# Patient Record
Sex: Female | Born: 1970 | Race: White | Hispanic: No | Marital: Single | Smoking: Former smoker
Health system: Southern US, Community
[De-identification: ages and names within clinical notes are randomized; demographics above are authoritative.]

## PROBLEM LIST (undated history)

## (undated) DIAGNOSIS — I2699 Other pulmonary embolism without acute cor pulmonale: Secondary | ICD-10-CM

## (undated) DIAGNOSIS — E119 Type 2 diabetes mellitus without complications: Secondary | ICD-10-CM

## (undated) DIAGNOSIS — I82401 Acute embolism and thrombosis of unspecified deep veins of right lower extremity: Secondary | ICD-10-CM

## (undated) DIAGNOSIS — L0390 Cellulitis, unspecified: Secondary | ICD-10-CM

## (undated) DIAGNOSIS — R0609 Other forms of dyspnea: Secondary | ICD-10-CM

## (undated) DIAGNOSIS — G4730 Sleep apnea, unspecified: Secondary | ICD-10-CM

## (undated) DIAGNOSIS — I10 Essential (primary) hypertension: Secondary | ICD-10-CM

## (undated) DIAGNOSIS — G509 Disorder of trigeminal nerve, unspecified: Secondary | ICD-10-CM

## (undated) DIAGNOSIS — M109 Gout, unspecified: Secondary | ICD-10-CM

## (undated) DIAGNOSIS — C55 Malignant neoplasm of uterus, part unspecified: Secondary | ICD-10-CM

## (undated) HISTORY — DX: Gout, unspecified: M10.9

---

## 2005-01-25 ENCOUNTER — Emergency Department (HOSPITAL_COMMUNITY): Admission: EM | Admit: 2005-01-25 | Discharge: 2005-01-25 | Payer: Self-pay | Admitting: Family Medicine

## 2005-12-06 ENCOUNTER — Emergency Department (HOSPITAL_COMMUNITY): Admission: EM | Admit: 2005-12-06 | Discharge: 2005-12-06 | Payer: Self-pay | Admitting: Family Medicine

## 2007-09-28 DIAGNOSIS — C55 Malignant neoplasm of uterus, part unspecified: Secondary | ICD-10-CM

## 2007-09-28 HISTORY — PX: ABDOMINAL HYSTERECTOMY: SHX81

## 2007-09-28 HISTORY — DX: Malignant neoplasm of uterus, part unspecified: C55

## 2009-06-29 ENCOUNTER — Emergency Department (HOSPITAL_COMMUNITY): Admission: EM | Admit: 2009-06-29 | Discharge: 2009-06-30 | Payer: Self-pay | Admitting: Emergency Medicine

## 2011-10-05 ENCOUNTER — Other Ambulatory Visit: Payer: Self-pay | Admitting: Family Medicine

## 2011-10-05 DIAGNOSIS — R109 Unspecified abdominal pain: Secondary | ICD-10-CM

## 2011-10-06 ENCOUNTER — Ambulatory Visit
Admission: RE | Admit: 2011-10-06 | Discharge: 2011-10-06 | Disposition: A | Discharge status: Home / Self Care | Payer: BC Managed Care – PPO | Source: Ambulatory Visit | Attending: Family Medicine | Admitting: Family Medicine

## 2011-10-06 DIAGNOSIS — R109 Unspecified abdominal pain: Secondary | ICD-10-CM

## 2012-07-12 ENCOUNTER — Other Ambulatory Visit: Payer: Self-pay | Admitting: Neurology

## 2012-07-12 DIAGNOSIS — G500 Trigeminal neuralgia: Secondary | ICD-10-CM

## 2012-07-12 DIAGNOSIS — G4733 Obstructive sleep apnea (adult) (pediatric): Secondary | ICD-10-CM

## 2012-07-12 DIAGNOSIS — Z8542 Personal history of malignant neoplasm of other parts of uterus: Secondary | ICD-10-CM

## 2012-07-12 DIAGNOSIS — I10 Essential (primary) hypertension: Secondary | ICD-10-CM

## 2012-07-19 ENCOUNTER — Ambulatory Visit
Admission: RE | Admit: 2012-07-19 | Discharge: 2012-07-19 | Disposition: A | Discharge status: Home / Self Care | Payer: BC Managed Care – PPO | Source: Ambulatory Visit | Attending: Neurology | Admitting: Neurology

## 2012-07-19 DIAGNOSIS — I10 Essential (primary) hypertension: Secondary | ICD-10-CM

## 2012-07-19 DIAGNOSIS — G500 Trigeminal neuralgia: Secondary | ICD-10-CM

## 2012-07-19 DIAGNOSIS — G4733 Obstructive sleep apnea (adult) (pediatric): Secondary | ICD-10-CM

## 2012-07-19 DIAGNOSIS — Z8542 Personal history of malignant neoplasm of other parts of uterus: Secondary | ICD-10-CM

## 2012-07-19 MED ORDER — GADOBENATE DIMEGLUMINE 529 MG/ML IV SOLN
20.0000 mL | Freq: Once | INTRAVENOUS | Status: AC | PRN
  Administered 2012-07-19: 20 mL via INTRAVENOUS

## 2012-10-05 DIAGNOSIS — R0609 Other forms of dyspnea: Secondary | ICD-10-CM

## 2012-10-05 DIAGNOSIS — R0600 Dyspnea, unspecified: Secondary | ICD-10-CM

## 2012-10-05 HISTORY — DX: Dyspnea, unspecified: R06.00

## 2012-10-05 HISTORY — DX: Other forms of dyspnea: R06.09

## 2012-10-09 ENCOUNTER — Emergency Department (HOSPITAL_COMMUNITY): Payer: BC Managed Care – PPO

## 2012-10-09 ENCOUNTER — Inpatient Hospital Stay (HOSPITAL_COMMUNITY)
Admission: EM | Admit: 2012-10-09 | Discharge: 2012-10-11 | DRG: 541 | Disposition: A | Discharge status: Home / Self Care | Payer: BC Managed Care – PPO | Attending: Internal Medicine | Admitting: Internal Medicine

## 2012-10-09 ENCOUNTER — Encounter (HOSPITAL_COMMUNITY): Payer: Self-pay | Admitting: *Deleted

## 2012-10-09 DIAGNOSIS — Z6841 Body Mass Index (BMI) 40.0 and over, adult: Secondary | ICD-10-CM

## 2012-10-09 DIAGNOSIS — G500 Trigeminal neuralgia: Secondary | ICD-10-CM | POA: Diagnosis present

## 2012-10-09 DIAGNOSIS — Z79899 Other long term (current) drug therapy: Secondary | ICD-10-CM

## 2012-10-09 DIAGNOSIS — R0600 Dyspnea, unspecified: Secondary | ICD-10-CM

## 2012-10-09 DIAGNOSIS — I2699 Other pulmonary embolism without acute cor pulmonale: Principal | ICD-10-CM

## 2012-10-09 DIAGNOSIS — I824Y9 Acute embolism and thrombosis of unspecified deep veins of unspecified proximal lower extremity: Secondary | ICD-10-CM | POA: Diagnosis present

## 2012-10-09 DIAGNOSIS — Z87891 Personal history of nicotine dependence: Secondary | ICD-10-CM

## 2012-10-09 DIAGNOSIS — T384X5A Adverse effect of oral contraceptives, initial encounter: Secondary | ICD-10-CM | POA: Diagnosis present

## 2012-10-09 DIAGNOSIS — Z23 Encounter for immunization: Secondary | ICD-10-CM

## 2012-10-09 DIAGNOSIS — Z8542 Personal history of malignant neoplasm of other parts of uterus: Secondary | ICD-10-CM

## 2012-10-09 DIAGNOSIS — E669 Obesity, unspecified: Secondary | ICD-10-CM | POA: Diagnosis present

## 2012-10-09 HISTORY — DX: Other pulmonary embolism without acute cor pulmonale: I26.99

## 2012-10-09 HISTORY — DX: Cellulitis, unspecified: L03.90

## 2012-10-09 HISTORY — DX: Other forms of dyspnea: R06.09

## 2012-10-09 HISTORY — DX: Disorder of trigeminal nerve, unspecified: G50.9

## 2012-10-09 HISTORY — DX: Sleep apnea, unspecified: G47.30

## 2012-10-09 HISTORY — DX: Malignant neoplasm of uterus, part unspecified: C55

## 2012-10-09 LAB — COMPREHENSIVE METABOLIC PANEL
Albumin: 3.9 g/dL (ref 3.5–5.2)
BUN: 17 mg/dL (ref 6–23)
CO2: 18 mEq/L — ABNORMAL LOW (ref 19–32)
Calcium: 9.3 mg/dL (ref 8.4–10.5)
GFR calc non Af Amer: >90 mL/min (ref >90)
Glucose, Bld: 86 mg/dL (ref 70–99)
Potassium: 5.3 mEq/L — ABNORMAL HIGH (ref 3.5–5.1)
Sodium: 138 mEq/L (ref 135–145)
Total Bilirubin: 0.2 mg/dL — ABNORMAL LOW (ref 0.3–1.2)
Total Protein: 7.6 g/dL (ref 6.0–8.3)

## 2012-10-09 LAB — CBC WITH DIFFERENTIAL/PLATELET
Eosinophils Absolute: 0.2 10*3/uL (ref 0.0–0.7)
HCT: 40.9 % (ref 36.0–46.0)
Hemoglobin: 13.9 g/dL (ref 12.0–15.0)
Lymphocytes Relative: 18 % (ref 12–46)
Lymphs Abs: 1.5 10*3/uL (ref 0.7–4.0)
MCHC: 34 g/dL (ref 30.0–36.0)
MCV: 83.5 fL (ref 78.0–100.0)
Neutro Abs: 6.1 10*3/uL (ref 1.7–7.7)
Neutrophils Relative %: 73 % (ref 43–77)
RBC: 4.9 MIL/uL (ref 3.87–5.11)
RDW: 13.2 % (ref 11.5–15.5)
WBC: 8.3 10*3/uL (ref 4.0–10.5)

## 2012-10-09 MED ORDER — IOHEXOL 350 MG/ML SOLN
100.0000 mL | Freq: Once | INTRAVENOUS | Status: AC | PRN
  Administered 2012-10-10: 100 mL via INTRAVENOUS

## 2012-10-09 NOTE — ED Notes (Signed)
Pt reports being in car x4 hours today and x4 hours on Thursday. EDP aware.

## 2012-10-09 NOTE — ED Notes (Signed)
Pt to ED c/o feeling sob since Thursday. States she took a decongestant for a cold and then took a tegretol after which she felt sob.  Sob has been increasing and increases with ambulation.  Pt did not come to ED earlier b/c she was out of town.  Denies any chest pain.

## 2012-10-09 NOTE — ED Provider Notes (Signed)
History     CSN: [id number]  Arrival date & time 10/09/12  1537   First MD Initiated Contact with Patient 10/09/12 2153      Chief Complaint  Patient presents with  . Shortness of Breath    (Consider location/radiation/quality/duration/timing/severity/associated sxs/prior treatment) HPI Comments: Shirley Schmidt is a 42 year old female patient with a history of a hysterectomy with hormone replacement and no cardiac or pulmonary history who presents to the emergency department complaining of shortness of breath that started last Thursday, while she was loading luggage into her vehicle, and has been intermittent since that time.  Patient states that it worsening of her shortness of breath this morning. She states episode of cyanosis is noted by her friend. This afternoon she had a near syncopal episode with exertion. She denies history of DVT or blood clots. Shortness of breath initially occurred only on exertion but now occurs at rest as well.  Associated symptoms are coughing, dizziness and intermittent diarrhea.  She denies any headache, dizziness, nausea, vomiting, neck pain, chest pain, syncope, abdominal pain, hemoptysis,    The history is provided by the patient and medical records.    Past Medical History  Diagnosis Date  . Trigeminal nerve disease   . Uterine cancer     Past Surgical History  Procedure Date  . Abdominal hysterectomy     No family history on file.  History  Substance Use Topics  . Smoking status: Former Games developer  . Smokeless tobacco: Not on file  . Alcohol Use: Yes     Comment: occasionally    OB History    Grav Para Term Preterm Abortions TAB SAB Ect Mult Living                  Review of Systems  Constitutional: Negative for fever, diaphoresis, appetite change, fatigue and unexpected weight change.  HENT: Negative for mouth sores and neck stiffness.   Eyes: Negative for visual disturbance.  Respiratory: Positive for shortness of breath.  Negative for cough, chest tightness and wheezing.   Cardiovascular: Negative for chest pain.  Gastrointestinal: Negative for nausea, vomiting, abdominal pain, diarrhea and constipation.  Genitourinary: Negative for dysuria, urgency, frequency and hematuria.  Skin: Negative for rash.  Neurological: Negative for syncope, light-headedness and headaches.  Psychiatric/Behavioral: Negative for sleep disturbance. The patient is not nervous/anxious.   All other systems reviewed and are negative.    Allergies  Review of patient's allergies indicates no known allergies.  Home Medications   Current Outpatient Rx  Name  Route  Sig  Dispense  Refill  . CARBAMAZEPINE ER 100 MG PO CP12   Oral   Take 400 mg by mouth 2 (two) times daily.         Marland Kitchen ESTROGENS CONJUGATED 0.45 MG PO TABS   Oral   Take 0.45 mg by mouth daily. Take daily for 21 days then do not take for 7 days.           BP 116/61  Pulse 99  Temp 98.9 F (37.2 C) (Oral)  Resp 21  Ht 5\' 5"  (1.651 m)  Wt 300 lb (136.079 kg)  BMI 49.92 kg/m2  SpO2 96%  Physical Exam  Nursing note and vitals reviewed. Constitutional: She is oriented to person, place, and time. She appears well-developed and well-nourished. No distress.  HENT:  Head: Normocephalic and atraumatic.  Mouth/Throat: Oropharynx is clear and moist. No oropharyngeal exudate.  Eyes: Conjunctivae normal are normal. Pupils are equal, round, and  reactive to light. No scleral icterus.  Neck: Normal range of motion. Neck supple.  Cardiovascular: Regular rhythm, S1 normal, S2 normal, normal heart sounds and intact distal pulses.  Tachycardia present.  Exam reveals no gallop and no friction rub.   No murmur heard. Pulses:      Radial pulses are 2+ on the right side, and 2+ on the left side.       Dorsalis pedis pulses are 2+ on the right side, and 2+ on the left side.       Posterior tibial pulses are 2+ on the right side, and 2+ on the left side.  Pulmonary/Chest:  Effort normal and breath sounds normal. No accessory muscle usage. Not tachypneic. No respiratory distress. She has no decreased breath sounds. She has no wheezes. She has no rhonchi. She has no rales. She exhibits no tenderness.  Abdominal: Soft. Bowel sounds are normal. She exhibits no distension and no mass. There is no tenderness. There is no rebound and no guarding.  Musculoskeletal: Normal range of motion. She exhibits no edema and no tenderness.  Lymphadenopathy:    She has no cervical adenopathy.  Neurological: She is alert and oriented to person, place, and time. She exhibits normal muscle tone. Coordination normal.       Speech is clear and goal oriented Moves extremities without ataxia  Skin: Skin is warm and dry. No rash noted. She is not diaphoretic. No erythema.  Psychiatric: She has a normal mood and affect.    ED Course  Procedures (including critical care time)  Labs Reviewed  CBC WITH DIFFERENTIAL - Abnormal; Notable for the following:    Platelets 147 (*)     All other components within normal limits  COMPREHENSIVE METABOLIC PANEL - Abnormal; Notable for the following:    Potassium 5.3 (*)  HEMOLYSIS AT THIS LEVEL MAY AFFECT RESULT   CO2 18 (*)     AST 46 (*)     Total Bilirubin 0.2 (*)     All other components within normal limits  D-DIMER, QUANTITATIVE - Abnormal; Notable for the following:    D-Dimer, Quant 10.39 (*)     All other components within normal limits  PRO B NATRIURETIC PEPTIDE - Abnormal; Notable for the following:    Pro B Natriuretic peptide (BNP) 1293.0 (*)     All other components within normal limits  POCT I-STAT TROPONIN I  HEPARIN LEVEL (UNFRACTIONATED)  CBC   Dg Chest 2 View  10/09/2012  *RADIOLOGY REPORT*  Clinical Data: Short of breath  CHEST - 2 VIEW  Comparison: None.  Findings: Lungs are under aerated.  Allowing for low volumes, the heart is upper normal in size.  Lungs are clear.  No pneumothorax. No pleural effusion.  IMPRESSION: No  active cardiopulmonary disease.   Original Report Authenticated By: [name], M.D.    Ct Angio Chest W/cm &/or Wo Cm  10/10/2012  *RADIOLOGY REPORT*  Clinical Data: Shortness of breath since Thursday.  Elevated D- dimer.  CT ANGIOGRAPHY CHEST  Technique:  Multidetector CT imaging of the chest using the standard protocol during bolus administration of intravenous contrast. Multiplanar reconstructed images including MIPs were obtained and reviewed to evaluate the vascular anatomy.  Contrast: OMNIPAQUE IOHEXOL 350 MG/ML SOLN  Comparison: None.  Findings: Technically adequate study with good opacification of the central and segmental pulmonary arteries.  Multiple filling defects are demonstrated in the distal right and left pulmonary arteries, extending into multiple bilateral upper and lower lobe branches.  Changes are consistent with significant pulmonary embolus.  Normal heart size.  Normal caliber thoracic aorta.  No significant lymphadenopathy in the chest.  The esophagus is decompressed. Small esophageal hiatal hernia.  Upper abdominal organs are not well visualized.  No pleural effusion.  Visualization of the lung parenchyma is limited due to respiratory motion artifact but no focal consolidation or pneumothorax is identified.  Airways appear patent.  Degenerative changes in the thoracic spine.  IMPRESSION: Multiple bilateral central and segmental pulmonary emboli.  Results were discussed by telephone with Dr. [name] at 0029 hours on 10/10/2012.   Original Report Authenticated By: [name], M.D.     ECG:  Date: 10/10/2012  Rate: 104  Rhythm: sinus tachycardia  QRS Axis: normal  Intervals: normal  ST/T Wave abnormalities: nonspecific ST changes  Conduction Disutrbances:none  Narrative Interpretation: t wave flattening and inversion V3-V6  Old EKG Reviewed: none available   CRITICAL CARE Performed by: Dierdre Forth   Total critical care time: 35 min  Critical care time  was exclusive of separately billable procedures and treating other patients.  Critical care was necessary to treat or prevent imminent or life-threatening deterioration.  Critical care was time spent personally by me on the following activities: development of treatment plan with patient and/or surrogate as well as nursing, discussions with consultants, evaluation of patient's response to treatment, examination of patient, obtaining history from patient or surrogate, ordering and performing treatments and interventions, ordering and review of laboratory studies, ordering and review of radiographic studies, pulse oximetry and re-evaluation of patient's condition.   1. Pulmonary embolism, bilateral       MDM  Shirley Schmidt presents with 4 days of shortness of breath acutely worsening this morning with accompanying near syncopal episode this afternoon.  Patient without major medical history, no history of DVT.  Patient is taken car rides but on Thursday and today, she is on exogenous estrogen.  Denies chest pain, hemoptysis.  Concern for PE versus other pulmonary etiology.  CBC unremarkable, CMP with hyperkalemia at 5.3 however blood was hemolyzed, troponin negative, BNP elevated to 1293 and d-dimer elevated to 10.39.  No concern for PE at this time. ECG nonischemic. CT angiogram pending.  CT angio: Multiple bilateral central and segmental pulmonary emboli  Will proceed with unassigned admission to step-down.  Pt VSS at this time.         Dahlia Client Shirley Stanczak, PA-C 10/10/12 0102  Dahlia Client Sheli Dorin, PA-C 10/10/12 0140

## 2012-10-09 NOTE — ED Notes (Signed)
EDP aware of D-dimer results

## 2012-10-09 NOTE — ED Notes (Signed)
Patient transported to CT 

## 2012-10-10 ENCOUNTER — Encounter (HOSPITAL_COMMUNITY): Payer: Self-pay | Admitting: Radiology

## 2012-10-10 DIAGNOSIS — G500 Trigeminal neuralgia: Secondary | ICD-10-CM

## 2012-10-10 DIAGNOSIS — R6889 Other general symptoms and signs: Secondary | ICD-10-CM

## 2012-10-10 DIAGNOSIS — R0989 Other specified symptoms and signs involving the circulatory and respiratory systems: Secondary | ICD-10-CM

## 2012-10-10 DIAGNOSIS — I2699 Other pulmonary embolism without acute cor pulmonale: Principal | ICD-10-CM

## 2012-10-10 DIAGNOSIS — R0600 Dyspnea, unspecified: Secondary | ICD-10-CM | POA: Diagnosis present

## 2012-10-10 DIAGNOSIS — I82409 Acute embolism and thrombosis of unspecified deep veins of unspecified lower extremity: Secondary | ICD-10-CM

## 2012-10-10 DIAGNOSIS — R0609 Other forms of dyspnea: Secondary | ICD-10-CM

## 2012-10-10 LAB — BASIC METABOLIC PANEL
BUN: 45 mg/dL — ABNORMAL HIGH (ref 6–23)
Calcium: 9.6 mg/dL (ref 8.4–10.5)
GFR calc non Af Amer: 54 mL/min — ABNORMAL LOW (ref >90)
Glucose, Bld: 145 mg/dL — ABNORMAL HIGH (ref 70–99)
Potassium: 4.6 mEq/L (ref 3.5–5.1)

## 2012-10-10 LAB — CBC
MCH: 28.2 pg (ref 26.0–34.0)
Platelets: 287 10*3/uL (ref 150–400)
RDW: 15 % (ref 11.5–15.5)

## 2012-10-10 LAB — HEMOGLOBIN A1C: Mean Plasma Glucose: 192 mg/dL — ABNORMAL HIGH (ref <117)

## 2012-10-10 LAB — PRO B NATRIURETIC PEPTIDE: Pro B Natriuretic peptide (BNP): 114.2 pg/mL (ref 0–125)

## 2012-10-10 LAB — HEPARIN LEVEL (UNFRACTIONATED): Heparin Unfractionated: 0.15 IU/mL — ABNORMAL LOW (ref 0.30–0.70)

## 2012-10-10 MED ORDER — GABAPENTIN 100 MG PO CAPS
100.0000 mg | ORAL_CAPSULE | Freq: Three times a day (TID) | ORAL | Status: DC
  Administered 2012-10-10 – 2012-10-11 (×2): 100 mg via ORAL
  Filled 2012-10-10 (×4): qty 1

## 2012-10-10 MED ORDER — HYDROCODONE-ACETAMINOPHEN 5-325 MG PO TABS: 1.0000 | ORAL_TABLET | ORAL | Status: DC | PRN

## 2012-10-10 MED ORDER — HYDROMORPHONE HCL PF 1 MG/ML IJ SOLN: 1.0000 mg | INTRAMUSCULAR | Status: DC | PRN

## 2012-10-10 MED ORDER — ACETAMINOPHEN 325 MG PO TABS: 650.0000 mg | ORAL_TABLET | Freq: Four times a day (QID) | ORAL | Status: DC | PRN

## 2012-10-10 MED ORDER — ALBUTEROL SULFATE (5 MG/ML) 0.5% IN NEBU: 2.5000 mg | INHALATION_SOLUTION | RESPIRATORY_TRACT | Status: DC | PRN

## 2012-10-10 MED ORDER — ACETAMINOPHEN 650 MG RE SUPP: 650.0000 mg | Freq: Four times a day (QID) | RECTAL | Status: DC | PRN

## 2012-10-10 MED ORDER — GUAIFENESIN-DM 100-10 MG/5ML PO SYRP: 5.0000 mL | ORAL_SOLUTION | ORAL | Status: DC | PRN

## 2012-10-10 MED ORDER — CARBAMAZEPINE ER 400 MG PO TB12
400.0000 mg | ORAL_TABLET | Freq: Every day | ORAL | Status: DC
  Filled 2012-10-10: qty 1

## 2012-10-10 MED ORDER — HEPARIN BOLUS VIA INFUSION
5000.0000 [IU] | Freq: Once | INTRAVENOUS | Status: AC
  Administered 2012-10-10: 5000 [IU] via INTRAVENOUS

## 2012-10-10 MED ORDER — ONDANSETRON HCL 4 MG PO TABS: 4.0000 mg | ORAL_TABLET | Freq: Four times a day (QID) | ORAL | Status: DC | PRN

## 2012-10-10 MED ORDER — CARBAMAZEPINE ER 100 MG PO CP12: 400.0000 mg | ORAL_CAPSULE | Freq: Two times a day (BID) | ORAL | Status: DC

## 2012-10-10 MED ORDER — ONDANSETRON HCL 4 MG/2ML IJ SOLN: 4.0000 mg | Freq: Three times a day (TID) | INTRAMUSCULAR | Status: DC | PRN

## 2012-10-10 MED ORDER — INFLUENZA VIRUS VACC SPLIT PF IM SUSP
0.5000 mL | INTRAMUSCULAR | Status: AC
  Administered 2012-10-11: 0.5 mL via INTRAMUSCULAR
  Filled 2012-10-10: qty 0.5

## 2012-10-10 MED ORDER — WARFARIN - PHARMACIST DOSING INPATIENT: Freq: Every day | Status: DC

## 2012-10-10 MED ORDER — RIVAROXABAN 15 MG PO TABS
15.0000 mg | ORAL_TABLET | Freq: Two times a day (BID) | ORAL | Status: DC
  Administered 2012-10-10 – 2012-10-11 (×2): 15 mg via ORAL
  Filled 2012-10-10 (×3): qty 1

## 2012-10-10 MED ORDER — SODIUM CHLORIDE 0.9 % IJ SOLN
3.0000 mL | Freq: Two times a day (BID) | INTRAMUSCULAR | Status: DC
  Administered 2012-10-10 – 2012-10-11 (×2): 3 mL via INTRAVENOUS

## 2012-10-10 MED ORDER — HEPARIN (PORCINE) IN NACL 100-0.45 UNIT/ML-% IJ SOLN
1850.0000 [IU]/h | INTRAMUSCULAR | Status: DC
  Administered 2012-10-10: 1850 [IU]/h via INTRAVENOUS
  Administered 2012-10-10: 1500 [IU]/h via INTRAVENOUS
  Filled 2012-10-10 (×2): qty 250

## 2012-10-10 MED ORDER — ONDANSETRON HCL 4 MG/2ML IJ SOLN: 4.0000 mg | Freq: Four times a day (QID) | INTRAMUSCULAR | Status: DC | PRN

## 2012-10-10 MED ORDER — HEPARIN BOLUS VIA INFUSION
3000.0000 [IU] | Freq: Once | INTRAVENOUS | Status: AC
  Administered 2012-10-10: 3000 [IU] via INTRAVENOUS
  Filled 2012-10-10: qty 3000

## 2012-10-10 MED ORDER — CARBAMAZEPINE ER 400 MG PO TB12
400.0000 mg | ORAL_TABLET | Freq: Two times a day (BID) | ORAL | Status: DC
  Administered 2012-10-10: 400 mg via ORAL
  Filled 2012-10-10 (×2): qty 1

## 2012-10-10 MED ORDER — ALUM & MAG HYDROXIDE-SIMETH 200-200-20 MG/5ML PO SUSP: 30.0000 mL | Freq: Four times a day (QID) | ORAL | Status: DC | PRN

## 2012-10-10 MED ORDER — RIVAROXABAN 15 MG PO TABS
15.0000 mg | ORAL_TABLET | ORAL | Status: AC
  Administered 2012-10-10: 15 mg via ORAL
  Filled 2012-10-10: qty 1

## 2012-10-10 NOTE — Progress Notes (Signed)
  Echocardiogram 2D Echocardiogram has been performed.  Shirley Schmidt A 10/10/2012, 3:11 PM

## 2012-10-10 NOTE — Progress Notes (Signed)
[facility] PROGRESS NOTE  Shirley Schmidt EAV:[id number] DOB: 1971-04-29 DOA: 10/09/2012 PCP: [name], MD  Assessment/Plan: 1. Bilateral PE and DVT: on xarelto. As per pharmacy, there are serious cross reaction between the xarelto and tegretol, that she takes for trigeminal neuralgia. But patient does not want to be on lovenox and coumadin and she wishes to stop of carbamazepine. Spoke to Dr [name] informally, about replacing carbamazepine with another medication, recommendations are to do a quick taper of carbamazepine in the next week and to start the patient on neurontin concomitantly and increase the dose of neurontin form 100 mg to 300mg  tid in the next three days and start the tapering carbamazepine. She follows up with Dr Maple Hudson at [facility]. Echo is pending.  2. Trigeminal neuralgia: as above,start tapering carbamazepine in three days ,. Currently she is on neurontin 100 mg TID, start increasing neurontin everyday to 300 mg tid.   Code Status: full code Family Communication: none at bedside Disposition Plan: possibly in 1 to 2 days.   Procedures:  Echo pending.  HPI/Subjective: She doesn't want to be on coumadin. She wishes to be off tegretol.  Objective: Filed Vitals:   10/10/12 0225 10/10/12 0230 10/10/12 0253 10/10/12 0815  BP: 132/83 121/94 160/98 122/76  Pulse: 102 104 104 114  Temp:   98.5 F (36.9 C) 99 F (37.2 C)  TempSrc:   Oral Oral  Resp: 16  16 14   Height:      Weight:      SpO2: 96% 97% 97% 95%    Intake/Output Summary (Last 24 hours) at 10/10/12 1858 Last data filed at 10/10/12 1700  Gross per 24 hour  Intake    840 ml  Output   1400 ml  Net   -560 ml   Filed Weights   10/09/12 1711 10/10/12 0039  Weight: 136.079 kg (300 lb) 136.079 kg (300 lb)    Exam:   General:  Alert afebrile comfortable, not in distress,   Cardiovascular: s1s2  Respiratory: ctab  Abdomen: soft NT ND BS+  Extremities: trace pedal edema.   Data  Reviewed: Basic Metabolic Panel:  Lab 10/10/12 7829 10/09/12 1731  NA 131* 138  K 4.6 5.3*  CL 95* 103  CO2 20 18*  GLUCOSE 145* 86  BUN 45* 17  CREATININE 1.24* 0.79  CALCIUM 9.6 9.3  MG -- --  PHOS -- --   Liver Function Tests:  Lab 10/09/12 1731  AST 46*  ALT 26  ALKPHOS 72  BILITOT 0.2*  PROT 7.6  ALBUMIN 3.9   No results found for this basename: LIPASE:5,AMYLASE:5 in the last 168 hours No results found for this basename: AMMONIA:5 in the last 168 hours CBC:  Lab 10/10/12 0530 10/09/12 1731  WBC 17.8* 8.3  NEUTROABS -- 6.1  HGB 12.4 13.9  HCT 37.9 40.9  MCV 86.3 83.5  PLT 287 147*   Cardiac Enzymes: No results found for this basename: CKTOTAL:5,CKMB:5,CKMBINDEX:5,TROPONINI:5 in the last 168 hours BNP (last 3 results)  Basename 10/10/12 0530 10/09/12 2234  PROBNP 114.2 1293.0*   CBG: No results found for this basename: GLUCAP:5 in the last 168 hours  No results found for this or any previous visit (from the past 240 hour(s)).   Studies: Dg Chest 2 View  10/09/2012  *RADIOLOGY REPORT*  Clinical Data: Short of breath  CHEST - 2 VIEW  Comparison: None.  Findings: Lungs are under aerated.  Allowing for low volumes, the heart is upper normal in size.  Lungs are clear.  No pneumothorax. No pleural effusion.  IMPRESSION: No active cardiopulmonary disease.   Original Report Authenticated By: [name], M.D.    Ct Angio Chest W/cm &/or Wo Cm  10/10/2012  *RADIOLOGY REPORT*  Clinical Data: Shortness of breath since Thursday.  Elevated D- dimer.  CT ANGIOGRAPHY CHEST  Technique:  Multidetector CT imaging of the chest using the standard protocol during bolus administration of intravenous contrast. Multiplanar reconstructed images including MIPs were obtained and reviewed to evaluate the vascular anatomy.  Contrast: OMNIPAQUE IOHEXOL 350 MG/ML SOLN  Comparison: None.  Findings: Technically adequate study with good opacification of the central and segmental  pulmonary arteries.  Multiple filling defects are demonstrated in the distal right and left pulmonary arteries, extending into multiple bilateral upper and lower lobe branches. Changes are consistent with significant pulmonary embolus.  Normal heart size.  Normal caliber thoracic aorta.  No significant lymphadenopathy in the chest.  The esophagus is decompressed. Small esophageal hiatal hernia.  Upper abdominal organs are not well visualized.  No pleural effusion.  Visualization of the lung parenchyma is limited due to respiratory motion artifact but no focal consolidation or pneumothorax is identified.  Airways appear patent.  Degenerative changes in the thoracic spine.  IMPRESSION: Multiple bilateral central and segmental pulmonary emboli.  Results were discussed by telephone with Dr. [name] at 0029 hours on 10/10/2012.   Original Report Authenticated By: [name], M.D.     Scheduled Meds:   . carbamazepine  400 mg Oral Daily  . gabapentin  100 mg Oral TID  . influenza  inactive virus vaccine  0.5 mL Intramuscular Tomorrow-1000  . rivaroxaban  15 mg Oral BID  . sodium chloride  3 mL Intravenous Q12H   Continuous Infusions:   Principal Problem:  *Bilateral Pulmonary embolism Active Problems:  Trigeminal neuralgia  Acute dyspnea        Shirley Schmidt  [facility] Pager [phone]. If 8PM-8AM, please contact night-coverage at [email or web address], password [facility] 10/10/2012, 6:58 PM  LOS: 1 day

## 2012-10-10 NOTE — Progress Notes (Signed)
ANTICOAGULATION CONSULT NOTE - Initial Consult  Pharmacy Consult for Xarelto Indication: pulmonary embolus  No Known Allergies  Patient Measurements: Height: 5\' 5"  (165.1 cm) Weight: 300 lb (136.079 kg) IBW/kg (Calculated) : 57  Heparin Dosing Weight:    Vital Signs: Temp: 99 F (37.2 C) (01/14 0815) Temp src: Oral (01/14 0815) BP: 122/76 mmHg (01/14 0815) Pulse Rate: 114  (01/14 0815)  Labs:  Basename 10/10/12 0530 10/09/12 1731  HGB 12.4 13.9  HCT 37.9 40.9  PLT 287 147*  APTT -- --  LABPROT 13.4 --  INR 1.03 --  HEPARINUNFRC 0.15* --  CREATININE 1.24* 0.79  CKTOTAL -- --  CKMB -- --  TROPONINI -- --    Estimated Creatinine Clearance: 84.4 ml/min (by C-G formula based on Cr of 1.24).   Medical History: Past Medical History  Diagnosis Date  . Trigeminal nerve disease   . Pulmonary embolism 10/09/2012    bilateral/notes 10/09/2012 (10/10/2012)  . Exertional dyspnea 10/05/2012    (10/10/2012)  . Sleep apnea     "no mask" (10/10/2012)  . Cellulitis ?2010    "right toe" (10/10/2012)  . Uterine cancer 2009    Assessment: Admit Complaint: Worsening SOB= B PE. Was on Premarin PTA  Anticoagulation: Bilateral PE.Change heparin to Xarelto today. CrCl 85. CBC WNL.  Infectious Disease: Tmax 99 with elevated WBC 17.8.  Cardiovascular: BP 122/76 but HR elevated to 114.  Endocrinology: Was on Premarin hormone replacement PTA.  Gastrointestinal / Nutrition:  Neurology: Trigeminal neuralgia on Tegretol  Nephrology: Scr 1.24 (big jump)  Pulmonary: new PE with pleuritic CP  Hematology / Oncology: h/o uterine cancer s/p TAH  PTA Medication Issues: None. D/C Premarin  Goal of Therapy:  Treatment for VTE Monitor platelets by anticoagulation protocol: Yes   Plan:  Xarelto 15 mg BID x 21 days. Then Xarelto 20mg  daily thereafter.  Merilynn Finland, [employer] 10/10/2012,10:51 AM

## 2012-10-10 NOTE — Progress Notes (Addendum)
ANTICOAGULATION CONSULT NOTE - Initial Consult  Pharmacy Consult for Heparin Indication: pulmonary embolus  No Known Allergies  Patient Measurements: Height: 5\' 5"  (165.1 cm) Weight: 300 lb (136.079 kg) IBW/kg (Calculated) : 57  Heparin Dosing Weight: 90 kg   Vital Signs: Temp: 98.5 F (36.9 C) (01/14 0253) Temp src: Oral (01/14 0253) BP: 160/98 mmHg (01/14 0253) Pulse Rate: 104  (01/14 0253)  Labs:  Basename 10/10/12 0530 10/09/12 1731  HGB 12.4 13.9  HCT 37.9 40.9  PLT 287 147*  APTT -- --  LABPROT 13.4 --  INR 1.03 --  HEPARINUNFRC 0.15* --  CREATININE -- 0.79  CKTOTAL -- --  CKMB -- --  TROPONINI -- --    Estimated Creatinine Clearance: 130.7 ml/min (by C-G formula based on Cr of 0.79).   Medical History: Past Medical History  Diagnosis Date  . Trigeminal nerve disease   . Uterine cancer     Medications:  Tegretol  Premarin  Assessment: 43 yo female with bilateral PE for Heparin  Goal of Therapy:  Heparin level 0.3-0.7 units/ml Monitor platelets by anticoagulation protocol: Yes   Plan:  Heparin 5000 units IV bolus, then 1500 units/hr Check heparin level in 6 hours.  Eddie Candle 10/10/2012,6:24 AM   Addendum: Initial level 0.15  Infusing OK per RN Will rebolus, increase heparin 1850 units/hr  Geannie Risen, PharmD, BCPS 10/10/2012 6:25 AM

## 2012-10-10 NOTE — Progress Notes (Signed)
VASCULAR LAB PRELIMINARY  PRELIMINARY  PRELIMINARY  PRELIMINARY  Bilateral lower extremity venous duplex  completed.    Preliminary report:  Right:  DVT noted from mid FV through Pop v and gastrocnemius veins.  Non occlusive DVT in the calf veins.  No evidence of superficial thrombosis.  No Baker's cyst.  Left:  No evidence of DVT, superficial thrombosis, or Baker's cyst.  Ayren Zumbro, RVT 10/10/2012, 12:34 PM

## 2012-10-10 NOTE — H&P (Signed)
History and Physical       Hospital Admission Note Date: 10/10/2012  Patient name: Shirley Schmidt Medical record number: [id number] Date of birth: May 18, 1971 Age: 42 y.o. Gender: female PCP: [name], MD    Chief Complaint:  Shortness of breath worsening in the last 4 days  HPI: Patient is a 42 year old female with history of hysterectomy, on Premarin hormone replacement, trigeminal neuralgia presented to the ED with complaints of shortness of breath that started 4 days ago on Thursday. Patient had driven to [place], [place] and returned today. She noticed that the shortness of breath was worse with exertion and she had dizziness with exertion. She otherwise denied any chest pain or syncopal episode. She denies any family history of clotting disorder. DD workup showed elevated d-dimer which prompted CTA of the chest and revealed bilateral pulmonary embolism.   Review of Systems:  Constitutional: Denies fever, chills, diaphoresis, appetite change and fatigue.  HEENT: Denies photophobia, eye pain, redness, hearing loss, ear pain, congestion, sore throat, rhinorrhea, sneezing, mouth sores, trouble swallowing, neck pain, neck stiffness and tinnitus.   Respiratory:  please see history of present illness  Cardiovascular: Denies chest pain, palpitations and leg swelling.  Gastrointestinal: Denies nausea, vomiting, abdominal pain, diarrhea, constipation, blood in stool and abdominal distention.  Genitourinary: Denies dysuria, urgency, frequency, hematuria, flank pain and difficulty urinating.  Musculoskeletal: Denies myalgias, back pain, joint swelling, arthralgias and gait problem.  Skin: Denies pallor, rash and wound.  Neurological: Denies seizures, syncope, weakness, numbness and headaches.  Hematological: Denies adenopathy. Easy bruising, personal or family bleeding history  Psychiatric/Behavioral: Denies suicidal ideation,  mood changes, confusion, nervousness, sleep disturbance and agitation  Past Medical History: Past Medical History  Diagnosis Date  . Trigeminal nerve disease   . Uterine cancer    Past Surgical History  Procedure Date  . Abdominal hysterectomy     Medications: Prior to Admission medications   Medication Sig Start Date End Date Taking? Authorizing Provider  carbamazepine (CARBATROL) 100 MG 12 hr capsule Take 400 mg by mouth 2 (two) times daily.   Yes Historical Provider, MD  estrogens, conjugated, (PREMARIN) 0.45 MG tablet Take 0.45 mg by mouth daily. Take daily for 21 days then do not take for 7 days.   Yes Historical Provider, MD    Allergies:  No Known Allergies  Social History:  reports that she has quit smoking. She does not have any smokeless tobacco history on file. She reports that she drinks alcohol. She reports that she uses illicit drugs (Marijuana).  Family History: No family history on file.  Physical Exam: Blood pressure 160/98, pulse 104, temperature 98.5 F (36.9 C), temperature source Oral, resp. rate 16, height 5\' 5"  (1.651 m), weight 136.079 kg (300 lb), SpO2 97.00%. General: Alert, awake, oriented x3, in no acute distress. HEENT: normocephalic, atraumatic, anicteric sclera, pink conjunctiva, pupils equal and reactive to light and accomodation, oropharynx clear Neck: supple, no masses or lymphadenopathy, no goiter, no bruits  Heart: Regular rate and rhythm, without murmurs, rubs or gallops. Lungs: Clear to auscultation bilaterally, no wheezing, rales or rhonchi. Abdomen: Soft, nontender, nondistended, positive bowel sounds, no masses. Extremities: No clubbing, cyanosis, right lower extremity edema. Neuro: Grossly intact, no focal neurological deficits, strength 5/5 upper and lower extremities bilaterally Psych: alert and oriented x 3, normal mood and affect Skin: no rashes or lesions, warm and dry   LABS on Admission:  Basic Metabolic Panel:  Lab  10/09/12 1731  NA 138  K 5.3*  CL 103  CO2 18*  GLUCOSE 86  BUN 17  CREATININE 0.79  CALCIUM 9.3  MG --  PHOS --   Liver Function Tests:  Lab 10/09/12 1731  AST 46*  ALT 26  ALKPHOS 72  BILITOT 0.2*  PROT 7.6  ALBUMIN 3.9   No results found for this basename: LIPASE:2,AMYLASE:2 in the last 168 hours No results found for this basename: AMMONIA:2 in the last 168 hours CBC:  Lab 10/09/12 1731  WBC 8.3  NEUTROABS 6.1  HGB 13.9  HCT 40.9  MCV 83.5  PLT 147*     Radiological Exams on Admission: Dg Chest 2 View  10/09/2012  *RADIOLOGY REPORT*  Clinical Data: Short of breath  CHEST - 2 VIEW  Comparison: None.  Findings: Lungs are under aerated.  Allowing for low volumes, the heart is upper normal in size.  Lungs are clear.  No pneumothorax. No pleural effusion.  IMPRESSION: No active cardiopulmonary disease.   Original Report Authenticated By: [name], M.D.    Ct Angio Chest W/cm &/or Wo Cm  10/10/2012  *RADIOLOGY REPORT*  Clinical Data: Shortness of breath since Thursday.  Elevated D- dimer.  CT ANGIOGRAPHY CHEST  Technique:  Multidetector CT imaging of the chest using the standard protocol during bolus administration of intravenous contrast. Multiplanar reconstructed images including MIPs were obtained and reviewed to evaluate the vascular anatomy.  Contrast: OMNIPAQUE IOHEXOL 350 MG/ML SOLN  Comparison: None.  Findings: Technically adequate study with good opacification of the central and segmental pulmonary arteries.  Multiple filling defects are demonstrated in the distal right and left pulmonary arteries, extending into multiple bilateral upper and lower lobe branches. Changes are consistent with significant pulmonary embolus.  Normal heart size.  Normal caliber thoracic aorta.  No significant lymphadenopathy in the chest.  The esophagus is decompressed. Small esophageal hiatal hernia.  Upper abdominal organs are not well visualized.  No pleural effusion.   Visualization of the lung parenchyma is limited due to respiratory motion artifact but no focal consolidation or pneumothorax is identified.  Airways appear patent.  Degenerative changes in the thoracic spine.  IMPRESSION: Multiple bilateral central and segmental pulmonary emboli.  Results were discussed by telephone with Dr. [name] at 0029 hours on 10/10/2012.   Original Report Authenticated By: [name], M.D.     Assessment/Plan Principal Problem:  *Acute dyspnea likely secondary to Bilateral Pulmonary embolism: ? Due to Hormone replacement - Admit to telemetry, hypercoagulation panel, will start on heparin drip and Coumadin per pharmacy. Patient will likely need to repeat hypercoagulation panel in 4-6 weeks . - Obtain Doppler ultrasound of the lower extremities to rule out DVT, obtain 2-D echocardiogram - Placed case management consult for possible insurance approval for xarelto  Active Problems:  Trigeminal neuralgia: Continue Tegretol  DVT prophylaxis: On full dose anticoagulation  CODE STATUS: Full code  Further plan will depend as patient's clinical course evolves and further radiologic and laboratory data become available.   Time Spent on Admission: 45 minutes  Shirley Schmidt M.D. [facility] 10/10/2012, 3:17 AM Pager: [phone]  If 7PM-7AM, please contact night-coverage [email or web address] Password [email or web address]

## 2012-10-11 LAB — BETA-2-GLYCOPROTEIN I ABS, IGG/M/A
Beta-2 Glyco I IgG: 0 G Units (ref <20)
Beta-2-Glycoprotein I IgA: 6 A Units (ref <20)
Beta-2-Glycoprotein I IgM: 19 M Units (ref <20)

## 2012-10-11 LAB — PROTEIN S ACTIVITY: Protein S Activity: 124 % (ref 69–129)

## 2012-10-11 LAB — PROTEIN C ACTIVITY: Protein C Activity: 200 % — ABNORMAL HIGH (ref 75–133)

## 2012-10-11 LAB — CARDIOLIPIN ANTIBODIES, IGG, IGM, IGA: Anticardiolipin IgG: 1 GPL U/mL — ABNORMAL LOW (ref <23)

## 2012-10-11 LAB — FACTOR 5 LEIDEN

## 2012-10-11 LAB — LUPUS ANTICOAGULANT PANEL: PTT Lupus Anticoagulant: 31.1 secs (ref 28.0–43.0)

## 2012-10-11 MED ORDER — HYDROCODONE-ACETAMINOPHEN 5-325 MG PO TABS: 1.0000 | ORAL_TABLET | ORAL | Status: DC | PRN

## 2012-10-11 MED ORDER — RIVAROXABAN 15 MG PO TABS: 15.0000 mg | ORAL_TABLET | Freq: Two times a day (BID) | ORAL | Status: DC

## 2012-10-11 MED ORDER — GABAPENTIN 100 MG PO CAPS: 300.0000 mg | ORAL_CAPSULE | Freq: Three times a day (TID) | ORAL | Status: DC

## 2012-10-11 NOTE — ED Provider Notes (Signed)
Medical screening examination/treatment/procedure(s) were performed by non-physician practitioner and as supervising physician I was immediately available for consultation/collaboration.   Spyros Winch L Velicia Dejager, MD 10/11/12 0000 

## 2012-10-11 NOTE — Discharge Summary (Signed)
Physician Discharge Summary  Patient ID: Shirley Schmidt MRN: [id number] DOB/AGE: 04-Nov-1970 42 y.o.  Admit date: 10/09/2012 Discharge date: 10/11/2012  Primary Care Physician:  [name], MD   Discharge Diagnoses:    Principal Problem:  *Bilateral Pulmonary embolism Active Problems:  Trigeminal neuralgia  Acute dyspnea      Medication List     As of 10/11/2012  1:45 PM    STOP taking these medications         carbamazepine 100 MG 12 hr capsule   Commonly known as: CARBATROL      estrogens (conjugated) 0.45 MG tablet   Commonly known as: PREMARIN      TAKE these medications         gabapentin 100 MG capsule   Commonly known as: NEURONTIN   Take 3 capsules (300 mg total) by mouth 3 (three) times daily.      HYDROcodone-acetaminophen 5-325 MG per tablet   Commonly known as: NORCO/VICODIN   Take 1-2 tablets by mouth every 4 (four) hours as needed.      Rivaroxaban 15 MG Tabs tablet   Commonly known as: XARELTO   Take 1 tablet (15 mg total) by mouth 2 (two) times daily. For 21 days, followed by 20 mg daily after 21 days are complete.         Disposition and Follow-up:  Will be discharged home today in stable and improved condition. Has been advised to follow up with her PCP and her neurologist in 2 weeks.  Consults:  None   Significant Diagnostic Studies:  Dg Chest 2 View  10/09/2012  *RADIOLOGY REPORT*  Clinical Data: Short of breath  CHEST - 2 VIEW  Comparison: None.  Findings: Lungs are under aerated.  Allowing for low volumes, the heart is upper normal in size.  Lungs are clear.  No pneumothorax. No pleural effusion.  IMPRESSION: No active cardiopulmonary disease.   Original Report Authenticated By: [name], M.D.    Ct Angio Chest W/cm &/or Wo Cm  10/10/2012  *RADIOLOGY REPORT*  Clinical Data: Shortness of breath since Thursday.  Elevated D- dimer.  CT ANGIOGRAPHY CHEST  Technique:  Multidetector CT imaging of the chest using the standard protocol  during bolus administration of intravenous contrast. Multiplanar reconstructed images including MIPs were obtained and reviewed to evaluate the vascular anatomy.  Contrast: OMNIPAQUE IOHEXOL 350 MG/ML SOLN  Comparison: None.  Findings: Technically adequate study with good opacification of the central and segmental pulmonary arteries.  Multiple filling defects are demonstrated in the distal right and left pulmonary arteries, extending into multiple bilateral upper and lower lobe branches. Changes are consistent with significant pulmonary embolus.  Normal heart size.  Normal caliber thoracic aorta.  No significant lymphadenopathy in the chest.  The esophagus is decompressed. Small esophageal hiatal hernia.  Upper abdominal organs are not well visualized.  No pleural effusion.  Visualization of the lung parenchyma is limited due to respiratory motion artifact but no focal consolidation or pneumothorax is identified.  Airways appear patent.  Degenerative changes in the thoracic spine.  IMPRESSION: Multiple bilateral central and segmental pulmonary emboli.  Results were discussed by telephone with Dr. [name] at 0029 hours on 10/10/2012.   Original Report Authenticated By: [name], M.D.     Brief H and P: For complete details please refer to admission H and P, but in brief patient is a 42 year old female with history of hysterectomy, on Premarin hormone replacement, trigeminal neuralgia presented to the ED with complaints of  shortness of breath that started 4 days ago on Thursday. Patient had driven to [place], [place] and returned today. She noticed that the shortness of breath was worse with exertion and she had dizziness with exertion. She otherwise denied any chest pain or syncopal episode. She denies any family history of clotting disorder. DD workup showed elevated d-dimer which prompted CTA of the chest and revealed bilateral pulmonary embolism.  We were asked to admit her for further  evaluation and management.    Hospital Course:  Principal Problem:  *Bilateral Pulmonary embolism Active Problems:  Trigeminal neuralgia  Acute dyspnea   Bilateral PE/DVT -Has risk factors including: hormone replacement therapy, prior smoking history, recent car trip, obesity and probably sederentarism. -Has been started on xarelto as she has refused to start coumadin therapy. -15 mg BID for 21 days followed by 20 mg daily for at least 6 months. -Has been advised to NEVER go back on HRT. She has already quit smoking. -Has also been counseled on life-style changes and weight loss.  Trigeminal Neuralgia -Significant interaction between Tegretol and Xarelto. -After informal consultation with neurology, Dr. [name], we have decided to take her off the tegretol and start her on neurontin. -Has been advised to follow up with her OP neurologist.  Rest of chronic medical issues have been stable this hospitalization.    Time spent on Discharge: Greater than 30 minutes.  SignedChaya Jan [facility] Pager: [phone] 10/11/2012, 1:45 PM

## 2012-10-11 NOTE — Care Management Note (Signed)
    Page 1 of 1   10/11/2012     3:17:42 PM   CARE MANAGEMENT NOTE 10/11/2012  Patient:  Shirley Schmidt, Shirley Schmidt   Account Number:  [id number]  Date Initiated:  10/11/2012  Documentation initiated by:  Malayna Noori  Subjective/Objective Assessment:   PT ADM WITH PE.  PTA, PT INDEPENDENT, LIVES WITH MOTHER.     Action/Plan:   PT TO DC ON XARELTO.  XARELTO CAREPATH CARD GIVEN TO HELP WITH COPAY COSTS.   Anticipated DC Date:  10/11/2012   Anticipated DC Plan:  HOME/SELF CARE      DC Planning Services  CM consult      Choice offered to / List presented to:             Status of service:  Completed, signed off Medicare Important Message given?   (If response is "NO", the following Medicare IM given date fields will be blank) Date Medicare IM given:   Date Additional Medicare IM given:    Discharge Disposition:  HOME/SELF CARE  Per UR Regulation:  Reviewed for med. necessity/level of care/duration of stay  If discussed at Long Length of Stay Meetings, dates discussed:    Comments:

## 2012-10-12 LAB — PROTEIN C, TOTAL: Protein C, Total: 140 % (ref 72–160)

## 2012-10-12 LAB — URINE CULTURE: Colony Count: 30000

## 2012-10-12 LAB — PROTEIN S, TOTAL: Protein S Ag, Total: 111 % (ref 60–150)

## 2012-11-03 ENCOUNTER — Encounter (HOSPITAL_BASED_OUTPATIENT_CLINIC_OR_DEPARTMENT_OTHER): Payer: Self-pay | Admitting: *Deleted

## 2012-11-03 ENCOUNTER — Emergency Department (HOSPITAL_BASED_OUTPATIENT_CLINIC_OR_DEPARTMENT_OTHER): Payer: BC Managed Care – PPO

## 2012-11-03 ENCOUNTER — Emergency Department (HOSPITAL_BASED_OUTPATIENT_CLINIC_OR_DEPARTMENT_OTHER)
Admission: EM | Admit: 2012-11-03 | Discharge: 2012-11-03 | Disposition: A | Discharge status: Home / Self Care | Payer: BC Managed Care – PPO | Attending: Emergency Medicine | Admitting: Emergency Medicine

## 2012-11-03 DIAGNOSIS — I2699 Other pulmonary embolism without acute cor pulmonale: Secondary | ICD-10-CM | POA: Insufficient documentation

## 2012-11-03 DIAGNOSIS — F1210 Cannabis abuse, uncomplicated: Secondary | ICD-10-CM | POA: Insufficient documentation

## 2012-11-03 DIAGNOSIS — Z8542 Personal history of malignant neoplasm of other parts of uterus: Secondary | ICD-10-CM | POA: Insufficient documentation

## 2012-11-03 DIAGNOSIS — Z7901 Long term (current) use of anticoagulants: Secondary | ICD-10-CM | POA: Insufficient documentation

## 2012-11-03 DIAGNOSIS — M7989 Other specified soft tissue disorders: Secondary | ICD-10-CM | POA: Insufficient documentation

## 2012-11-03 DIAGNOSIS — Z872 Personal history of diseases of the skin and subcutaneous tissue: Secondary | ICD-10-CM | POA: Insufficient documentation

## 2012-11-03 DIAGNOSIS — M79606 Pain in leg, unspecified: Secondary | ICD-10-CM

## 2012-11-03 DIAGNOSIS — Z8709 Personal history of other diseases of the respiratory system: Secondary | ICD-10-CM | POA: Insufficient documentation

## 2012-11-03 DIAGNOSIS — M25559 Pain in unspecified hip: Secondary | ICD-10-CM | POA: Insufficient documentation

## 2012-11-03 DIAGNOSIS — Z87891 Personal history of nicotine dependence: Secondary | ICD-10-CM | POA: Insufficient documentation

## 2012-11-03 DIAGNOSIS — Z79899 Other long term (current) drug therapy: Secondary | ICD-10-CM | POA: Insufficient documentation

## 2012-11-03 DIAGNOSIS — Z8669 Personal history of other diseases of the nervous system and sense organs: Secondary | ICD-10-CM | POA: Insufficient documentation

## 2012-11-03 DIAGNOSIS — Z86718 Personal history of other venous thrombosis and embolism: Secondary | ICD-10-CM | POA: Insufficient documentation

## 2012-11-03 HISTORY — DX: Acute embolism and thrombosis of unspecified deep veins of right lower extremity: I82.401

## 2012-11-03 LAB — CBC
Platelets: 162 10*3/uL (ref 150–400)
RDW: 13.4 % (ref 11.5–15.5)
WBC: 5.5 10*3/uL (ref 4.0–10.5)

## 2012-11-03 LAB — PROTIME-INR: Prothrombin Time: 16.7 seconds — ABNORMAL HIGH (ref 11.6–15.2)

## 2012-11-03 LAB — BASIC METABOLIC PANEL
Chloride: 102 mEq/L (ref 96–112)
GFR calc Af Amer: >90 mL/min (ref >90)
Potassium: 4 mEq/L (ref 3.5–5.1)

## 2012-11-03 LAB — APTT: aPTT: 33 seconds (ref 24–37)

## 2012-11-03 NOTE — ED Notes (Signed)
Pt reports left thigh pain last night- on blood thinner for dvt right leg and saddle clot in lungs- states was hospitalized on Jan 13&14 for PE

## 2012-11-03 NOTE — ED Notes (Signed)
Linker MD at bedside. 

## 2012-11-03 NOTE — ED Notes (Signed)
No new rx- work note given

## 2012-11-03 NOTE — ED Provider Notes (Signed)
History     CSN: [id number]  Arrival date & time 11/03/12  4696   First MD Initiated Contact with Patient 11/03/12 1055      Chief Complaint  Patient presents with  . Leg Pain    (Consider location/radiation/quality/duration/timing/severity/associated sxs/prior treatment) HPI Pt presents with c/o left thigh pain.   Pt states the pain is at the junction of her hip and pelvis in the front.  Pain is worse with lifting her leg up- especially when using the clutch on her manual car.  Pt is on xarelto for DVT in right leg and pulmonary embolism.  She has not missed any doses.  No chest pain or shortness of breath.  No swelling of left leg or redness.  Right leg is swollen but has been so since diagnosis of DVT.  Movement and palpation makes symptoms worse.  There are no other associated systemic symptoms, there are no other alleviating or modifying factors.   Past Medical History  Diagnosis Date  . Trigeminal nerve disease   . Pulmonary embolism 10/09/2012    bilateral/notes 10/09/2012 (10/10/2012)  . Exertional dyspnea 10/05/2012    (10/10/2012)  . Sleep apnea     "no mask" (10/10/2012)  . Cellulitis ?2010    "right toe" (10/10/2012)  . Uterine cancer 2009  . Right leg DVT     Past Surgical History  Procedure Date  . Abdominal hysterectomy 2009    No family history on file.  History  Substance Use Topics  . Smoking status: Former Smoker -- 0.7 packs/day for 17 years    Types: Cigarettes    Quit date: 01/23/2008  . Smokeless tobacco: Never Used  . Alcohol Use: Yes     Comment: 10/10/2012 "rarely; drank this past weekend but hadn't drank before that in awhile"    OB History    Grav Para Term Preterm Abortions TAB SAB Ect Mult Living                  Review of Systems ROS reviewed and all otherwise negative except for mentioned in HPI  Allergies  Review of patient's allergies indicates no known allergies.  Home Medications   Current Outpatient Rx  Name  Route  Sig   Dispense  Refill  . GABAPENTIN 100 MG PO CAPS   Oral   Take 3 capsules (300 mg total) by mouth 3 (three) times daily.   90 capsule   1   . HYDROCODONE-ACETAMINOPHEN 5-325 MG PO TABS   Oral   Take 1-2 tablets by mouth every 4 (four) hours as needed.   30 tablet   0   . RIVAROXABAN 15 MG PO TABS   Oral   Take 1 tablet (15 mg total) by mouth 2 (two) times daily. For 21 days, followed by 20 mg daily after 21 days are complete.   42 tablet   0     BP 127/79  Pulse 62  Temp 98.1 F (36.7 C) (Oral)  Resp 20  Ht 5\' 5"  (1.651 m)  Wt 307 lb (139.254 kg)  BMI 51.09 kg/m2  SpO2 97% Vitals reviewed Physical Exam Physical Examination: General appearance - alert, well appearing, and in no distress Mental status - alert, oriented to person, place, and time Eyes - no scleral icterus, no conjunctival injection Mouth - mucous membranes moist, pharynx normal without lesions Chest - clear to auscultation, no wheezes, rales or rhonchi, symmetric air entry Heart - normal rate, regular rhythm, normal S1, S2, no murmurs,  rubs, clicks or gallops Abdomen - soft, nontender, nondistended, no masses or organomegaly Musculoskeletal - no joint tenderness, deformity or swelling Extremities - peripheral pulses normal, mild pedal edema of right lower extremity, no edema of left lower extremity, no discoloration,  no clubbing or cyanosis Skin - normal coloration and turgor, no rashes  ED Course  Procedures (including critical care time)  Labs Reviewed  PROTIME-INR - Abnormal; Notable for the following:    Prothrombin Time 16.7 (*)     All other components within normal limits  CBC  BASIC METABOLIC PANEL  APTT   US Venous Img Lower Unilateral Left  11/03/2012  *RADIOLOGY REPORT*  Clinical Data: Left thigh pain.  Recent right lower extremity DVT with bilateral pulmonary embolism.  LEFT LOWER EXTREMITY VENOUS DUPLEX ULTRASOUND  Technique:  Gray-scale sonography with graded compression, as well as  color Doppler and duplex ultrasound were performed to evaluate the deep venous system of the lower extremity from the level of the common femoral vein through the popliteal and proximal calf veins. Spectral Doppler was utilized to evaluate flow at rest and with distal augmentation maneuvers.  Comparison:  None.  Findings:  Normal compressibility of the common femoral, superficial femoral, and popliteal veins is demonstrated, as well as the visualized proximal calf veins.  No filling defects to suggest DVT on grayscale or color Doppler imaging.  Doppler waveforms show normal direction of venous flow, normal respiratory phasicity and response to augmentation.  The femoral vein of the left thigh is partially duplicated.  No superficial thrombophlebitis or abnormal fluid collections are identified.  IMPRESSION: No evidence of left lower extremity deep vein thrombosis.   Original Report Authenticated By: Irish Lack, M.D.      1. Lower extremity pain       MDM  Pt presenting with c/o pain in left thigh with hx of DVT in right leg and PE on xarelto.  Pt has no evidence of DVT in left leg.  No signs or symtpoms of worsening or new PE.  Labs reassuring.  Pt advised to f/u with her doctor and continue xarelto as prescribed.  Discharged with strict return precautions.  Pt agreeable with plan.        Ethelda Chick, MD 11/03/12 [phone]

## 2013-02-08 ENCOUNTER — Ambulatory Visit: Payer: Self-pay | Admitting: Nurse Practitioner

## 2013-06-09 IMAGING — US US EXTREM LOW VENOUS*L*
1 series · 14 of 23 positions shown · non-contrast
Comparison: None.

CLINICAL DATA: Left thigh pain.  Recent right lower extremity DVT
with bilateral pulmonary embolism.

LEFT LOWER EXTREMITY VENOUS DUPLEX ULTRASOUND
TECHNIQUE: Gray-scale sonography with graded compression, as well
as color Doppler and duplex ultrasound were performed to evaluate
the deep venous system of the lower extremity from the level of the
common femoral vein through the popliteal and proximal calf veins.
Spectral Doppler was utilized to evaluate flow at rest and with
distal augmentation maneuvers.

[Series 1: us extrem low venous*left* · 14 of 23 slices shown]
[im 1/23]
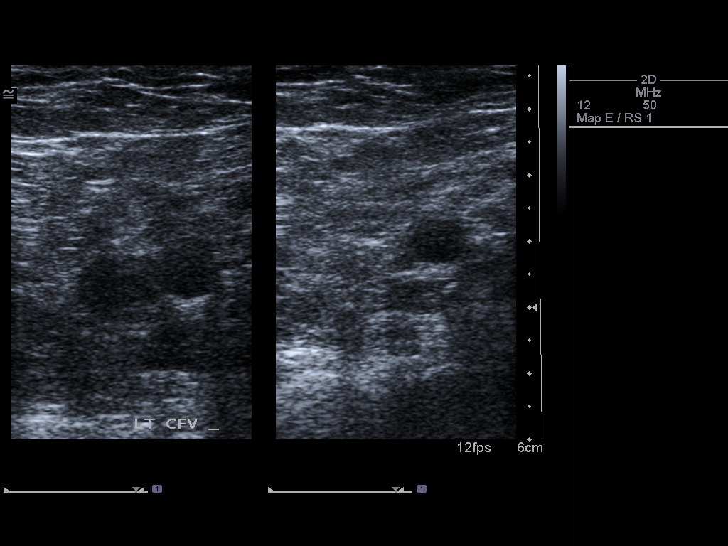
[im 3/23]
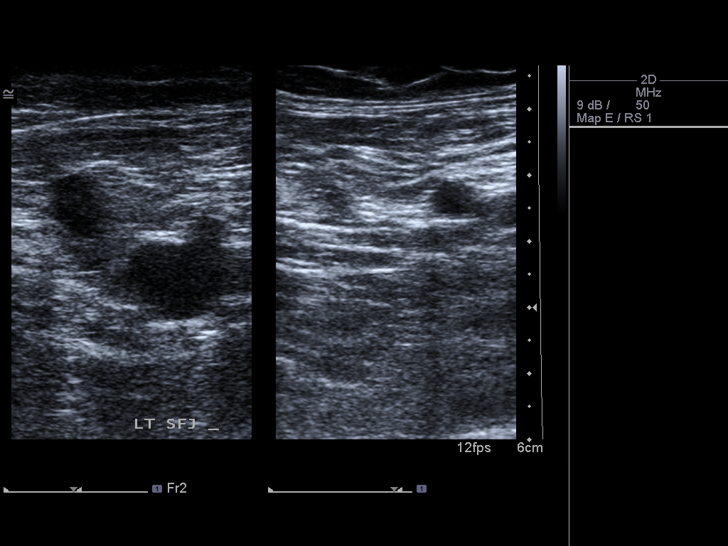
[im 5/23]
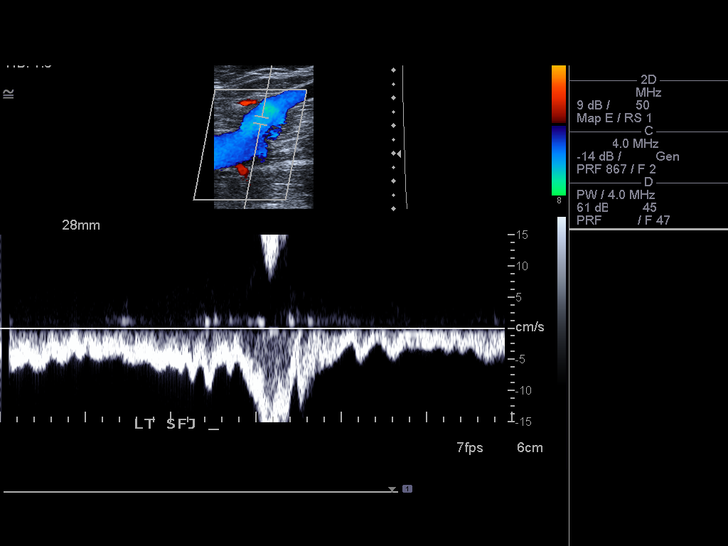
[im 6/23]
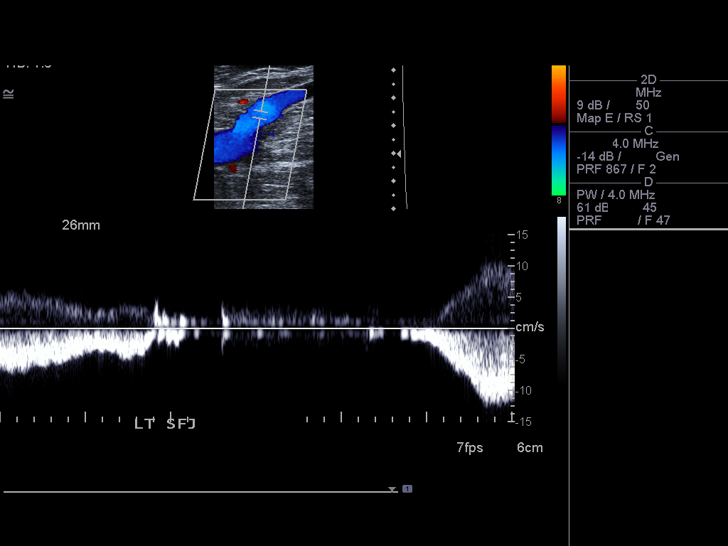
[im 8/23]
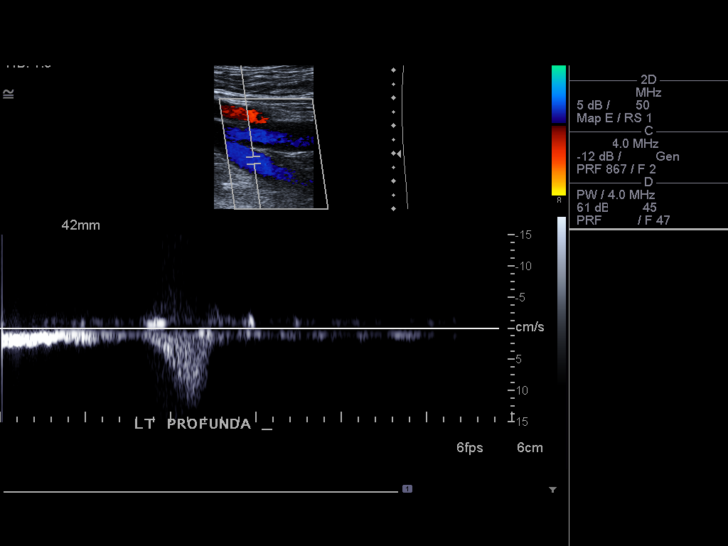
[im 10/23]
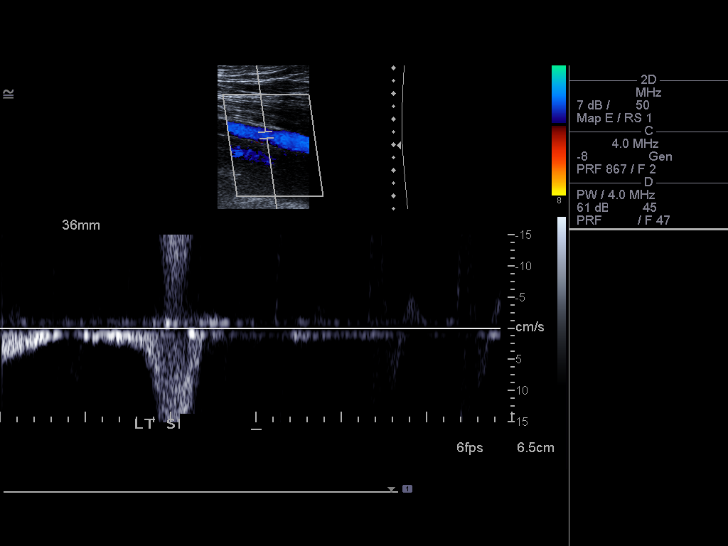
[im 11/23]
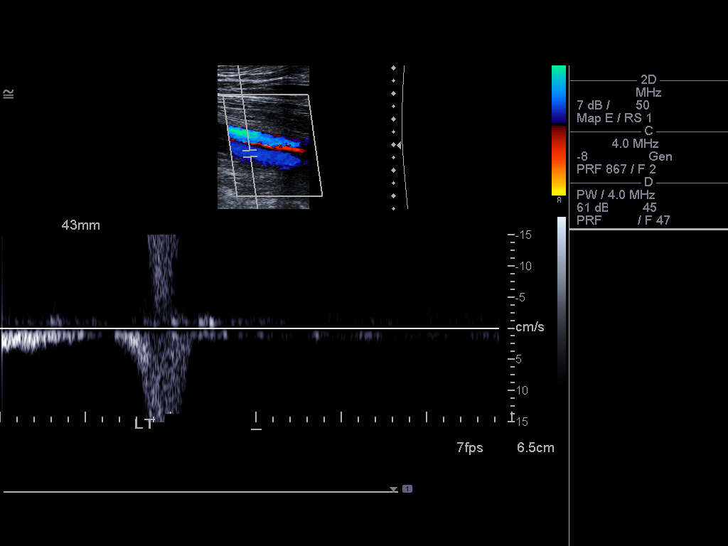
[im 13/23]
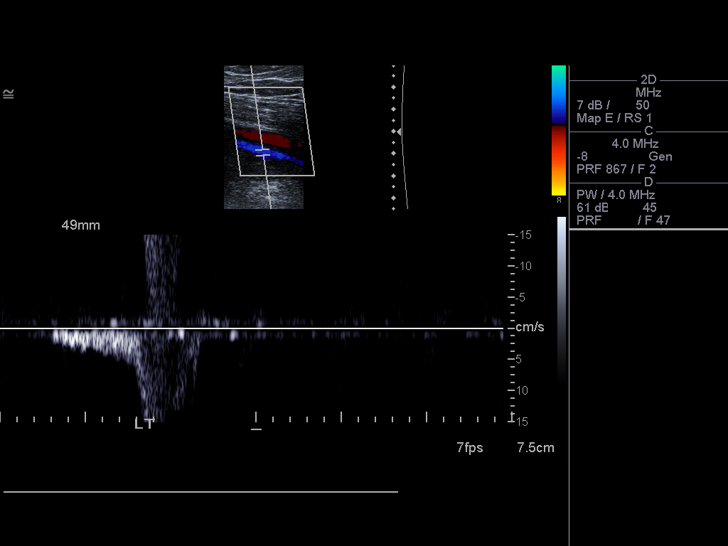
[im 14/23]
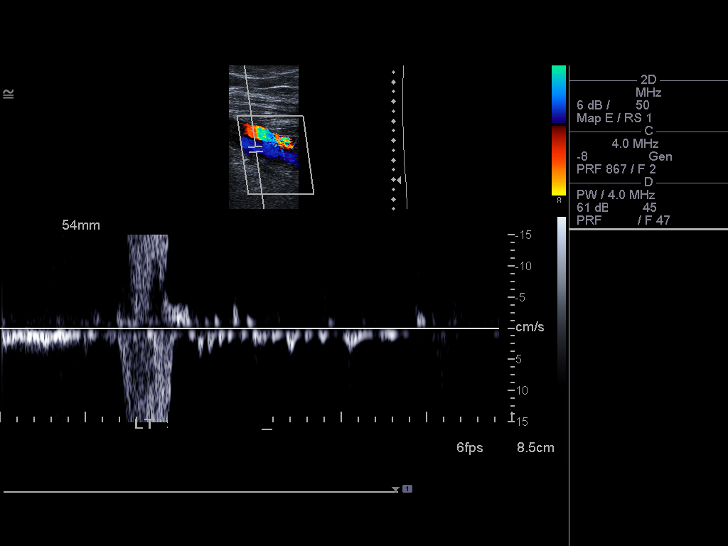
[im 16/23]
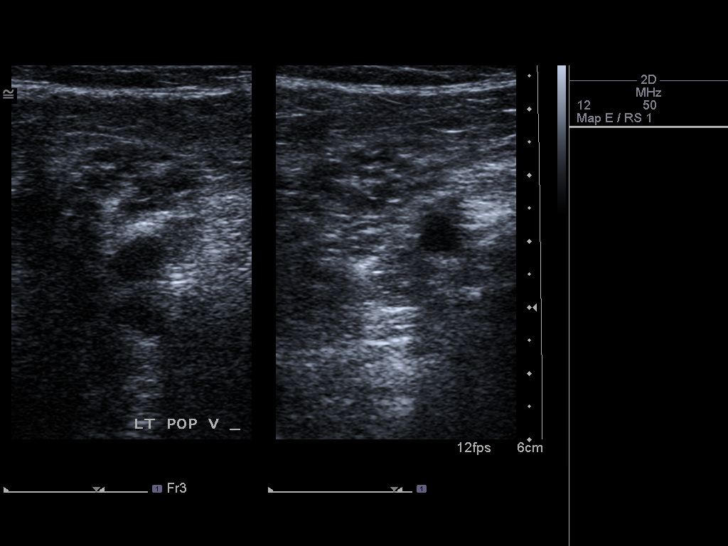
[im 18/23]
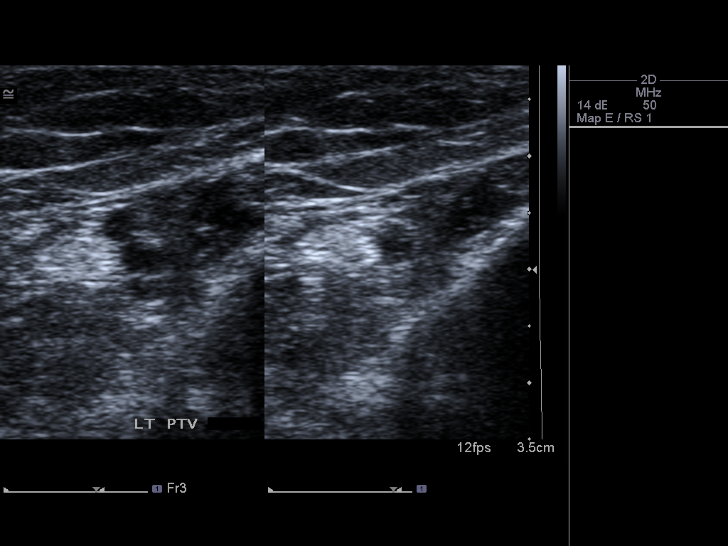
[im 19/23]
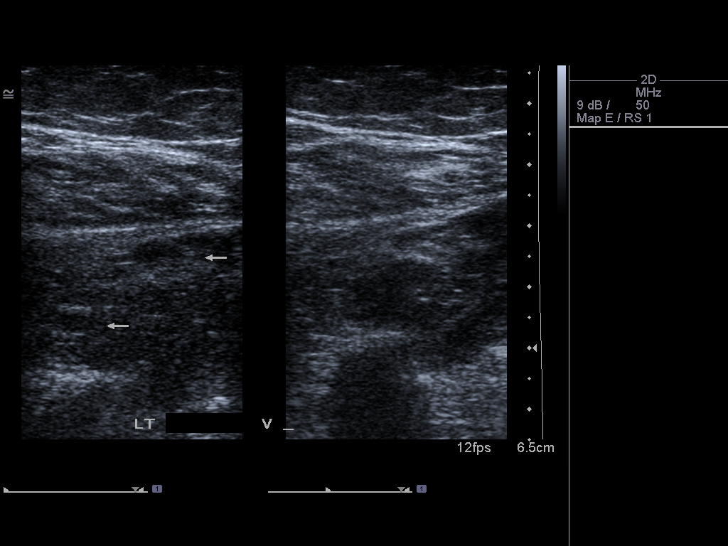
[im 21/23]
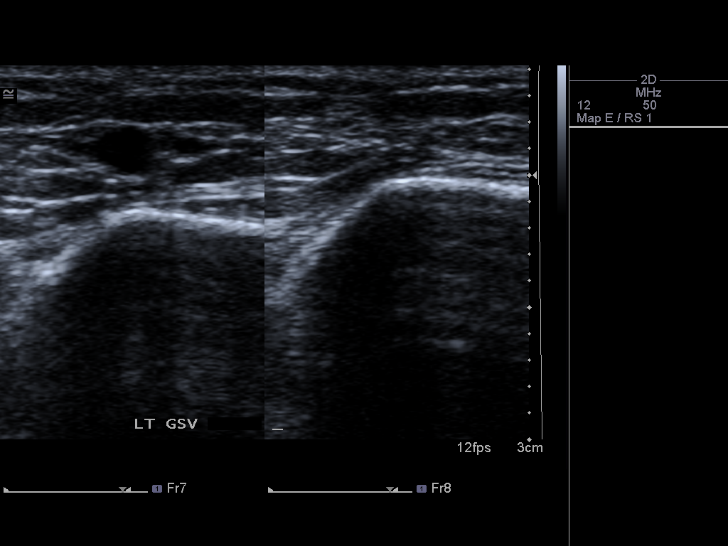
[im 23/23]
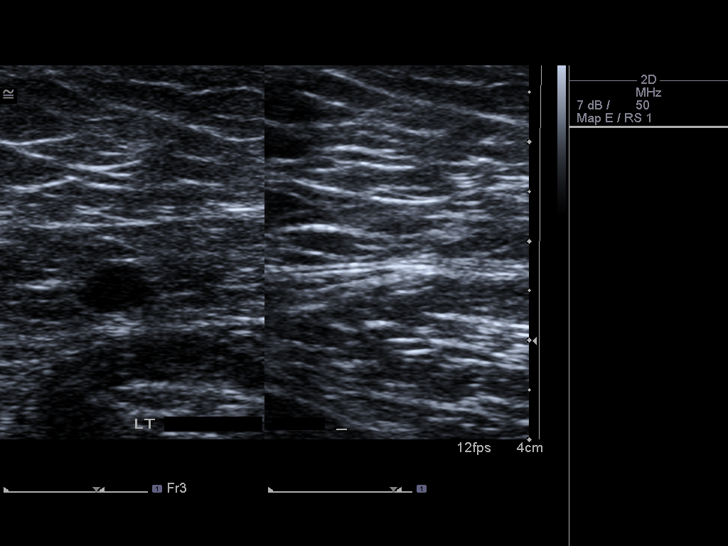

[14 of 23 positions shown; findings below may reference images not displayed]

FINDINGS: Normal compressibility of the common femoral,
superficial femoral, and popliteal veins is demonstrated, as well
as the visualized proximal calf veins.  No filling defects to
suggest DVT on grayscale or color Doppler imaging.  Doppler
waveforms show normal direction of venous flow, normal respiratory
phasicity and response to augmentation.

The femoral vein of the left thigh is partially duplicated.  No
superficial thrombophlebitis or abnormal fluid collections are
identified.
IMPRESSION: No evidence of left lower extremity deep vein thrombosis.

## 2013-10-09 ENCOUNTER — Emergency Department (INDEPENDENT_AMBULATORY_CARE_PROVIDER_SITE_OTHER): Payer: Self-pay

## 2013-10-09 ENCOUNTER — Emergency Department (INDEPENDENT_AMBULATORY_CARE_PROVIDER_SITE_OTHER)
Admission: EM | Admit: 2013-10-09 | Discharge: 2013-10-09 | Disposition: A | Discharge status: Home / Self Care | Payer: Self-pay | Source: Home / Self Care | Attending: Family Medicine | Admitting: Family Medicine

## 2013-10-09 ENCOUNTER — Encounter (HOSPITAL_COMMUNITY): Payer: Self-pay | Admitting: Emergency Medicine

## 2013-10-09 DIAGNOSIS — M109 Gout, unspecified: Secondary | ICD-10-CM

## 2013-10-09 MED ORDER — HYDROCODONE-ACETAMINOPHEN 5-325 MG PO TABS: 1.0000 | ORAL_TABLET | Freq: Four times a day (QID) | ORAL | Status: DC | PRN

## 2013-10-09 MED ORDER — INDOMETHACIN 25 MG PO CAPS: 25.0000 mg | ORAL_CAPSULE | Freq: Three times a day (TID) | ORAL | Status: DC

## 2013-10-09 NOTE — ED Provider Notes (Signed)
Shirley Schmidt is a 43 y.o. female who presents to Urgent Care today for left dorsal foot pain occurring today without injury. Patient denies any radiating pain fevers chills nausea vomiting or diarrhea. She notes the pain is moderate and started this morning. Pain is worse with activity. She notes a slightly swollen and warm to the touch. She has a history of cellulitis and left foot. Her current pain is much less painful than the previous cellulitis pain. Additionally she notes a history of pulmonary embolism occurring about one year ago.   Past Medical History  Diagnosis Date  . Trigeminal nerve disease   . Pulmonary embolism 10/09/2012    bilateral/notes 10/09/2012 (10/10/2012)  . Exertional dyspnea 10/05/2012    (10/10/2012)  . Sleep apnea     "no mask" (10/10/2012)  . Cellulitis ?2010    "right toe" (10/10/2012)  . Uterine cancer 2009  . Right leg DVT    History  Substance Use Topics  . Smoking status: Former Smoker -- 0.75 packs/day for 17 years    Types: Cigarettes    Quit date: 01/23/2008  . Smokeless tobacco: Never Used  . Alcohol Use: Yes     Comment: 10/10/2012 "rarely; drank this past weekend but hadn't drank before that in awhile"   ROS as above Medications reviewed. No current facility-administered medications for this encounter.   Current Outpatient Prescriptions  Medication Sig Dispense Refill  . HYDROcodone-acetaminophen (NORCO/VICODIN) 5-325 MG per tablet Take 1-2 tablets by mouth every 4 (four) hours as needed.  30 tablet  0  . gabapentin (NEURONTIN) 100 MG capsule Take 3 capsules (300 mg total) by mouth 3 (three) times daily.  90 capsule  1  . Rivaroxaban (XARELTO) 15 MG TABS tablet Take 1 tablet (15 mg total) by mouth 2 (two) times daily. For 21 days, followed by 20 mg daily after 21 days are complete.  42 tablet  0    Exam:  BP 148/82  Pulse 82  Temp(Src) 98.6 F (37 C) (Oral)  Resp 24  SpO2 100% Gen: Well NAD morbidly obese Lungs: Normal work of breathing.  CTABL Heart: RRR no MRG Abd: NABS, Soft. NT, ND Exts: Non edematous BL  LE, warm and well perfused.  Left foot: Mild ankle effusion.  No erythema or induration present Tender to palpation anterior ankle and dorsal midfoot.  Pain with rotation of the foot along the long axis. Ankle motion is intact as is toe motion Capillary refill and sensation is intact distally.   Limited musculoskeletal ultrasound of the left foot: Anterior ankle talar tibial joint visualized mild effusion present.  Navicular talar joint is visualized with no significant effusion Navicular cuneiform joint visualized moderate effusion present. Tender palpation over the navicular middle cuneiform joint.  Mild edema present overlying the cuneiforms.    No results found for this or any previous visit (from the past 24 hour(s)). Dg Foot Complete Left  10/09/2013   CLINICAL DATA:  Pain about the dorsum of the foot.  EXAM: LEFT FOOT - COMPLETE 3+ VIEW  COMPARISON:  None.  FINDINGS: No acute bony or joint abnormality is identified. The dorsal and plantar calcaneal spurring is noted. Soft tissue structures are unremarkable.  IMPRESSION: No acute abnormality.  Calcaneal spurring.   Electronically Signed   By: Inge Rise M.D.   On: 10/09/2013 19:31    Assessment and Plan: 43 y.o. female with midfoot pain and ankle pain of the left foot. These have occurred without injury. Specifically I am concerned about  gout. I believe gout to be much more likely than cellulitis given no erythema or induration.  Additionally patient has a joint effusion of the midfoot.  Plan to treat with indomethacin and Norco.  I am using indomethacin as patient does not have health insurance and cannot afford colchicine.  I recommend patient followup with her primary care provider in 2-3 weeks.  Additionally will she will followup if worsening.   Discussed warning signs or symptoms. Please see discharge instructions. Patient expresses  understanding.    Gregor Hams, MD 10/09/13 2010

## 2013-10-09 NOTE — ED Notes (Signed)
Woke up with L foot pain this AM.  No known injury.  States its swollen. Hx. Cellulitis in R foot in 2012.  Has bump on dorsum of L foot and ankle appears swollen.  No redness and is not warm to the touch.

## 2013-10-09 NOTE — Discharge Instructions (Signed)
Thank you for coming in today. Take indomethacin three times daily for at least a week.  Take norco for severe pain.   followup with your Dr. Soon.  Come back if not getting better or worsening.  If you get dramatically worse go to the emergency room. Call or go to the emergency room if you get worse, have trouble breathing, have chest pains, or palpitations.   Gout Gout is an inflammatory arthritis caused by a buildup of uric acid crystals in the joints. Uric acid is a chemical that is normally present in the blood. When the level of uric acid in the blood is too high it can form crystals that deposit in your joints and tissues. This causes joint redness, soreness, and swelling (inflammation). Repeat attacks are common. Over time, uric acid crystals can form into masses (tophi) near a joint, destroying bone and causing disfigurement. Gout is treatable and often preventable. CAUSES  The disease begins with elevated levels of uric acid in the blood. Uric acid is produced by your body when it breaks down a naturally found substance called purines. Certain foods you eat, such as meats and fish, contain high amounts of purines. Causes of an elevated uric acid level include:  Being passed down from parent to child (heredity).  Diseases that cause increased uric acid production (such as obesity, psoriasis, and certain cancers).  Excessive alcohol use.  Diet, especially diets rich in meat and seafood.  Medicines, including certain cancer-fighting medicines (chemotherapy), water pills (diuretics), and aspirin.  Chronic kidney disease. The kidneys are no longer able to remove uric acid well.  Problems with metabolism. Conditions strongly associated with gout include:  Obesity.  High blood pressure.  High cholesterol.  Diabetes. Not everyone with elevated uric acid levels gets gout. It is not understood why some people get gout and others do not. Surgery, joint injury, and eating too much of  certain foods are some of the factors that can lead to gout attacks. SYMPTOMS   An attack of gout comes on quickly. It causes intense pain with redness, swelling, and warmth in a joint.  Fever can occur.  Often, only one joint is involved. Certain joints are more commonly involved:  Base of the big toe.  Knee.  Ankle.  Wrist.  Finger. Without treatment, an attack usually goes away in a few days to weeks. Between attacks, you usually will not have symptoms, which is different from many other forms of arthritis. DIAGNOSIS  Your caregiver will suspect gout based on your symptoms and exam. In some cases, tests may be recommended. The tests may include:  Blood tests.  Urine tests.  X-rays.  Joint fluid exam. This exam requires a needle to remove fluid from the joint (arthrocentesis). Using a microscope, gout is confirmed when uric acid crystals are seen in the joint fluid. TREATMENT  There are two phases to gout treatment: treating the sudden onset (acute) attack and preventing attacks (prophylaxis).  Treatment of an Acute Attack.  Medicines are used. These include anti-inflammatory medicines or steroid medicines.  An injection of steroid medicine into the affected joint is sometimes necessary.  The painful joint is rested. Movement can worsen the arthritis.  You may use warm or cold treatments on painful joints, depending which works best for you.  Treatment to Prevent Attacks.  If you suffer from frequent gout attacks, your caregiver may advise preventive medicine. These medicines are started after the acute attack subsides. These medicines either help your kidneys eliminate uric  acid from your body or decrease your uric acid production. You may need to stay on these medicines for a very long time.  The early phase of treatment with preventive medicine can be associated with an increase in acute gout attacks. For this reason, during the first few months of treatment, your  caregiver may also advise you to take medicines usually used for acute gout treatment. Be sure you understand your caregiver's directions. Your caregiver may make several adjustments to your medicine dose before these medicines are effective.  Discuss dietary treatment with your caregiver or dietitian. Alcohol and drinks high in sugar and fructose and foods such as meat, poultry, and seafood can increase uric acid levels. Your caregiver or dietician can advise you on drinks and foods that should be limited. HOME CARE INSTRUCTIONS   Do not take aspirin to relieve pain. This raises uric acid levels.  Only take over-the-counter or prescription medicines for pain, discomfort, or fever as directed by your caregiver.  Rest the joint as much as possible. When in bed, keep sheets and blankets off painful areas.  Keep the affected joint raised (elevated).  Apply warm or cold treatments to painful joints. Use of warm or cold treatments depends on which works best for you.  Use crutches if the painful joint is in your leg.  Drink enough fluids to keep your urine clear or pale yellow. This helps your body get rid of uric acid. Limit alcohol, sugary drinks, and fructose drinks.  Follow your dietary instructions. Pay careful attention to the amount of protein you eat. Your daily diet should emphasize fruits, vegetables, whole grains, and fat-free or low-fat milk products. Discuss the use of coffee, vitamin C, and cherries with your caregiver or dietician. These may be helpful in lowering uric acid levels.  Maintain a healthy body weight. SEEK MEDICAL CARE IF:   You develop diarrhea, vomiting, or any side effects from medicines.  You do not feel better in 24 hours, or you are getting worse. SEEK IMMEDIATE MEDICAL CARE IF:   Your joint becomes suddenly more tender, and you have chills or a fever. MAKE SURE YOU:   Understand these instructions.  Will watch your condition.  Will get help right away  if you are not doing well or get worse. Document Released: 09/10/2000 Document Revised: 01/08/2013 Document Reviewed: 04/26/2012 [facility] Patient Information 2014 [place].

## 2014-01-03 ENCOUNTER — Encounter (HOSPITAL_COMMUNITY): Payer: Self-pay | Admitting: Emergency Medicine

## 2014-01-03 ENCOUNTER — Emergency Department (HOSPITAL_COMMUNITY)
Admission: EM | Admit: 2014-01-03 | Discharge: 2014-01-03 | Disposition: A | Discharge status: Home / Self Care | Payer: BC Managed Care – PPO | Source: Home / Self Care | Attending: Emergency Medicine | Admitting: Emergency Medicine

## 2014-01-03 DIAGNOSIS — J45909 Unspecified asthma, uncomplicated: Secondary | ICD-10-CM

## 2014-01-03 MED ORDER — PREDNISONE 20 MG PO TABS: 20.0000 mg | ORAL_TABLET | Freq: Two times a day (BID) | ORAL | Status: DC

## 2014-01-03 MED ORDER — TRAMADOL HCL 50 MG PO TABS: 100.0000 mg | ORAL_TABLET | Freq: Three times a day (TID) | ORAL | Status: DC | PRN

## 2014-01-03 MED ORDER — HYDROCOD POLST-CHLORPHEN POLST 10-8 MG/5ML PO LQCR: 5.0000 mL | Freq: Two times a day (BID) | ORAL | Status: DC | PRN

## 2014-01-03 MED ORDER — ALBUTEROL SULFATE HFA 108 (90 BASE) MCG/ACT IN AERS: 2.0000 | INHALATION_SPRAY | Freq: Four times a day (QID) | RESPIRATORY_TRACT | Status: DC

## 2014-01-03 MED ORDER — AZITHROMYCIN 250 MG PO TABS: ORAL_TABLET | ORAL | Status: DC

## 2014-01-03 NOTE — ED Provider Notes (Signed)
Chief Complaint   Chief Complaint  Patient presents with  . URI    History of Present Illness   Shirley Schmidt is a 43 year old female who has had a four-day history of cough productive of clear sputum, chest pain, nasal congestion with clear drainage, headache, sinus pressure, ear congestion her eyes have been watering and red. She's had a low-grade fever of no higher than 100, chills, and sweats. She's also had sore throat and nausea. Her mother is being treated for pneumonia. No other sick exposures.  Review of Systems   Other than as noted above, the patient denies any of the following symptoms: Systemic:  No fevers, chills, sweats, or myalgias. Eye:  No redness or discharge. ENT:  No ear pain, headache, nasal congestion, drainage, sinus pressure, or sore throat. Neck:  No neck pain, stiffness, or swollen glands. Lungs:  No cough, sputum production, hemoptysis, wheezing, chest tightness, shortness of breath or chest pain. GI:  No abdominal pain, nausea, vomiting or diarrhea.  [place]   Past medical history, family history, social history, meds, and allergies were reviewed.   Physical exam   Vital signs:  BP 135/89  Pulse 66  Temp(Src) 98.5 F (36.9 C) (Oral)  Resp 20  SpO2 98% General:  Alert and oriented.  In no distress.  Skin warm and dry. Eye:  No conjunctival injection or drainage. Lids were normal. ENT:  TMs and canals were normal, without erythema or inflammation.  Nasal mucosa was clear and uncongested, without drainage.  Mucous membranes were moist.  Pharynx was clear with no exudate or drainage.  There were no oral ulcerations or lesions. Neck:  Supple, no adenopathy, tenderness or mass. Lungs:  No respiratory distress.   She has bilateral, scattered expiratory wheezes, no rales or rhonchi, good air movement bilaterally.  Heart:  Regular rhythm, without gallops, murmers or rubs. Skin:  Clear, warm, and dry, without rash or lesions.   Assessment     The  encounter diagnosis was Asthmatic bronchitis.  Plan    1.  Meds:  The following meds were prescribed:   Discharge Medication List as of 01/03/2014 11:43 AM    START taking these medications   Details  albuterol (PROVENTIL HFA;VENTOLIN HFA) 108 (90 BASE) MCG/ACT inhaler Inhale 2 puffs into the lungs 4 (four) times daily., Starting 01/03/2014, Until Discontinued, Normal    azithromycin (ZITHROMAX Z-PAK) 250 MG tablet Take as directed., Normal    chlorpheniramine-HYDROcodone (TUSSIONEX) 10-8 MG/5ML LQCR Take 5 mLs by mouth every 12 (twelve) hours as needed for cough., Starting 01/03/2014, Until Discontinued, Normal    predniSONE (DELTASONE) 20 MG tablet Take 1 tablet (20 mg total) by mouth 2 (two) times daily., Starting 01/03/2014, Until Discontinued, Normal    traMADol (ULTRAM) 50 MG tablet Take 2 tablets (100 mg total) by mouth every 8 (eight) hours as needed., Starting 01/03/2014, Until Discontinued, Normal        2.  Patient Education/Counseling:  The patient was given appropriate handouts, self care instructions, and instructed in symptomatic relief.  Instructed to get extra fluids, rest, and use a cool mist vaporizer.    3.  Follow up:  The patient was told to follow up here if no better in 3 to 4 days, or sooner if becoming worse in any way, and given some red flag symptoms such as increasing fever, difficulty breathing, chest pain, or persistent vomiting which would prompt immediate return.  Follow up here as needed.      Harden Mo,  MD 01/03/14 1312

## 2014-01-03 NOTE — ED Notes (Signed)
C/o cold sx States she has a cough, fever, watery eyes, head pressure and stuffy nose Ibuprofen and mucinex was taking as tx

## 2014-01-03 NOTE — Discharge Instructions (Signed)
Most upper respiratory infections are caused by viruses and do not require antibiotics.  We try to save the antibiotics for when we really need them to prevent bacteria from developing resistance to them.  Here are a few hints about things that can be done at home to help get over an upper respiratory infection quicker:  Get extra sleep and extra fluids.  Get 7 to 9 hours of sleep per night and 6 to 8 glasses of water a day.  Getting extra sleep keeps the immune system from getting run down.  Most people with an upper respiratory infection are a little dehydrated.  The extra fluids also keep the secretions liquified and easier to deal with.  Also, get extra vitamin C.  4000 mg per day is the recommended dose. For the aches, headache, and fever, acetaminophen or ibuprofen are helpful.  These can be alternated every 4 hours.  People with liver disease should avoid large amounts of acetaminophen, and people with ulcer disease, gastroesophageal reflux, gastritis, congestive heart failure, chronic kidney disease, coronary artery disease and the elderly should avoid ibuprofen. For nasal congestion try Mucinex-D, or if you're having lots of sneezing or clear nasal drainage use Zyrtec-D. People with high blood pressure can take these if their blood pressure is controlled, if not, it's best to avoid the forms with a "D" (decongestants).  You can use the plain Mucinex, Allegra, Claritin, or Zyrtec even if your blood pressure is not controlled.   A Saline nasal spray such as Ocean Spray can also help.  You can add a decongestant sprays such as Afrin, but you should not use the decongestant sprays for more than 3 or 4 days since they can be habituating.  Breathe Rite nasal strips can also offer a non-drug alternative treatment to nasal congestion, especially at night. For people with symptoms of sinusitis, sleeping with your head elevated can be helpful.  For sinus pain, moist, hot compresses to the face may provide some  relief.  Many people find that inhaling steam as in a shower or from a pot of steaming water can help. For any viral infection, zinc containing lozenges such as Cold-Eze or Zicam are helpful.  Zinc helps to fight viral infection.  Hot salt water gargles (8 oz of hot water, 1/2 tsp of table salt, and a pinch of baking soda) can give relief as well as hot beverages such as hot tea.  Sucrets extra strength lozenges will help the sore throat.  For the cough, take Delsym 2 tsp every 12 hours.  It has also been found recently that Aleve can help control a cough.  The dose is 1 to 2 tablets twice daily with food.  This can be combined with Delsym. (Note, if you are taking ibuprofen, you should not take Aleve as well--take one or the other.) A cool mist vaporizer will help keep your mucous membranes from drying out.   It's important when you have an upper respiratory infection not to pass the infection to others.  This involves being very careful about the following:  Frequent hand washing or use of hand sanitizer, especially after coughing, sneezing, blowing your nose or touching your face, nose or eyes. Do not shake hands or touch anyone and try to avoid touching surfaces that other people use such as doorknobs, shopping carts, telephones and computer keyboards. Use tissues and dispose of them properly in a garbage can or ziplock bag. Cough into your sleeve. Do not let others eat or  drink after you.  It's also important to recognize the signs of serious illness and get evaluated if they occur: Any respiratory infection that lasts more than 7 to 10 days.  Yellow nasal drainage and sputum are not reliable indicators of a bacterial infection, but if they last for more than 1 week, see your doctor. Fever and sore throat can indicate strep. Fever and cough can indicate influenza or pneumonia. Any kind of severe symptom such as difficulty breathing, intractable vomiting, or severe pain should prompt you to see  a doctor as soon as possible.   Your body's immune system is really the thing that will get rid of this infection.  Your immune system is comprised of 2 types of specialized cells called T cells and B cells.  T cells coordinate the array of cells in your body that engulf invading bacteria or viruses while B cells orchestrate the production of antibodies that neutralize infection.  Anything we do or any medications we give you, will just strengthen your immune system or help it clear up the infection quicker.  Here are a few helpful hints to improve your immune system to help overcome this illness or to prevent future infections:  A few vitamins can improve the health of your immune system.  That's why your diet should include plenty of fruits, vegetables, fish, nuts, and whole grains.  Vitamin A and bet-carotene can increase the cells that fight infections (T cells and B cells).  Vitamin A is abundant in dark greens and orange vegetables such as spinach, greens, sweet potatoes, and carrots.  Vitamin B6 contributes to the maturation of white blood cells, the cells that fight disease.  Foods with vitamin B6 include cold cereal and bananas.  Vitamin C is credited with preventing colds because it increases white blood cells and also prevents cellular damage.  Citrus fruits, peaches and green and red bell peppers are all hight in vitamin C.  Vitamin E is an anti-oxidant that encourages the production of natural killer cells which reject foreign invaders and B cells that produce antibodies.  Foods high in vitamin E include wheat germ, nuts and seeds.  Foods high in omega-3 fatty acids found in foods like salmon, tuna and mackerel boost your immune system and help cells to engulf and absorb germs.  Probiotics are good bacteria that increase your T cells.  These can be found in yogurt and are available in supplements such as Culturelle or Align.  Moderate exercise increases the strength of your immune  system and your ability to recover from illness.  I suggest 3 to 5 moderate intensity 30 minute workouts per week.    Sleep is another component of maintaining a strong immune system.  It enables your body to recuperate from the day's activities, stress and work.  My recommendation is to get between 7 and 9 hours of sleep per night.  If you smoke, try to quit completely or at least cut down.  Drink alcohol only in moderation if at all.  No more than 2 drinks daily for men or 1 for women.  Get a flu vaccine early in the fall or if you have not gotten one yet, once this illness has run its course.  If you are over 65, a smoker, or an asthmatic, get a pneumococcal vaccine.  My final recommendation is to maintain a healthy weight.  Excess weight can impair the immune system by interfering with the way the immune system deals with invading viruses or  bacteria.

## 2014-07-13 ENCOUNTER — Encounter (HOSPITAL_COMMUNITY): Payer: Self-pay | Admitting: Emergency Medicine

## 2014-07-13 ENCOUNTER — Emergency Department (HOSPITAL_COMMUNITY)
Admission: EM | Admit: 2014-07-13 | Discharge: 2014-07-13 | Disposition: A | Discharge status: Home / Self Care | Payer: BC Managed Care – PPO | Source: Home / Self Care | Attending: Family Medicine | Admitting: Family Medicine

## 2014-07-13 DIAGNOSIS — M1000 Idiopathic gout, unspecified site: Secondary | ICD-10-CM

## 2014-07-13 DIAGNOSIS — M109 Gout, unspecified: Secondary | ICD-10-CM

## 2014-07-13 MED ORDER — COLCHICINE 0.6 MG PO TABS: ORAL_TABLET | ORAL | Status: DC

## 2014-07-13 NOTE — ED Notes (Signed)
Pt states that right foot has gout flare up pt has hx of gout. Pt states its been this way since 07/06/14. Pt is in no acute distress at this time.

## 2014-07-13 NOTE — ED Provider Notes (Signed)
CSN: [id number]     Arrival date & time 07/13/14  1239 History   First MD Initiated Contact with Patient 07/13/14 1259     Chief Complaint  Patient presents with  . Gout    right foot   (Consider location/radiation/quality/duration/timing/severity/associated sxs/prior Treatment) HPI Comments: Acute exacerbation of gout at base of right great toe over the past one week. Has taken colchicine in the past for same, but states it has been quite sometime and she no longer has an active prescription. No recent illness or injury   The history is provided by the patient.    Past Medical History  Diagnosis Date  . Trigeminal nerve disease   . Pulmonary embolism 10/09/2012    bilateral/notes 10/09/2012 (10/10/2012)  . Exertional dyspnea 10/05/2012    (10/10/2012)  . Sleep apnea     "no mask" (10/10/2012)  . Cellulitis ?2010    "right toe" (10/10/2012)  . Uterine cancer 2009  . Right leg DVT    Past Surgical History  Procedure Laterality Date  . Abdominal hysterectomy  2009   Family History  Problem Relation Age of Onset  . Diabetes Mother   . Heart attack Father    History  Substance Use Topics  . Smoking status: Former Smoker -- 0.75 packs/day for 17 years    Types: Cigarettes    Quit date: 01/23/2008  . Smokeless tobacco: Never Used  . Alcohol Use: Yes     Comment: rarely last drink was 1 glass of wine 3 weeks ago   OB History   Grav Para Term Preterm Abortions TAB SAB Ect Mult Living                 Review of Systems  All other systems reviewed and are negative.   Allergies  Review of patient's allergies indicates no known allergies.  Home Medications   Prior to Admission medications   Medication Sig Start Date End Date Taking? Authorizing Provider  albuterol (PROVENTIL HFA;VENTOLIN HFA) 108 (90 BASE) MCG/ACT inhaler Inhale 2 puffs into the lungs 4 (four) times daily. 01/03/14   Harden Mo, MD  azithromycin (ZITHROMAX Z-PAK) 250 MG tablet Take as directed. 01/03/14    Harden Mo, MD  chlorpheniramine-HYDROcodone (TUSSIONEX) 10-8 MG/5ML LQCR Take 5 mLs by mouth every 12 (twelve) hours as needed for cough. 01/03/14   Harden Mo, MD  colchicine 0.6 MG tablet Take two tabs by mouth at one time and then a third tablet 1 hour later 07/13/14   Lutricia Feil, PA  HYDROcodone-acetaminophen (NORCO/VICODIN) 5-325 MG per tablet Take 1 tablet by mouth every 6 (six) hours as needed. 10/09/13   Gregor Hams, MD  indomethacin (INDOCIN) 25 MG capsule Take 1 capsule (25 mg total) by mouth 3 (three) times daily with meals. 10/09/13   Gregor Hams, MD  predniSONE (DELTASONE) 20 MG tablet Take 1 tablet (20 mg total) by mouth 2 (two) times daily. 01/03/14   Harden Mo, MD  traMADol (ULTRAM) 50 MG tablet Take 2 tablets (100 mg total) by mouth every 8 (eight) hours as needed. 01/03/14   Harden Mo, MD   BP 125/93  Pulse 80  Temp(Src) 98.1 F (36.7 C) (Oral)  Resp 18  Ht 5' 5"  (1.651 m)  SpO2 95% Physical Exam  Nursing note and vitals reviewed. Constitutional: She is oriented to person, place, and time. She appears well-developed and well-nourished. No distress.  +obese  HENT:  Head: Normocephalic and atraumatic.  Eyes: Conjunctivae are normal. No scleral icterus.  Cardiovascular: Normal rate, regular rhythm and normal heart sounds.   Pulmonary/Chest: Effort normal and breath sounds normal.  Neurological: She is alert and oriented to person, place, and time.  Skin: Skin is warm and dry. There is erythema.  +podagra base of right great toe  Psychiatric: She has a normal mood and affect. Her behavior is normal.    ED Course  Procedures (including critical care time) Labs Review Labs Reviewed - No data to display  Imaging Review No results found.   MDM   1. Podagra   Colchicine as directed with PCP follow up. Referred to Dr. Ernie Hew.   Lutricia Feil, [place] 07/13/14 1332

## 2014-07-13 NOTE — Discharge Instructions (Signed)

## 2014-07-17 NOTE — ED Provider Notes (Signed)
Medical screening examination/treatment/procedure(s) were performed by resident physician or non-physician practitioner and as supervising physician I was immediately available for consultation/collaboration.   Pauline Good MD.   Billy Fischer, MD 07/17/14 2004

## 2015-05-07 ENCOUNTER — Other Ambulatory Visit: Payer: Self-pay | Admitting: Family Medicine

## 2015-05-07 DIAGNOSIS — Z1231 Encounter for screening mammogram for malignant neoplasm of breast: Secondary | ICD-10-CM

## 2015-05-12 ENCOUNTER — Ambulatory Visit: Payer: Self-pay

## 2015-06-27 ENCOUNTER — Ambulatory Visit
Admission: RE | Admit: 2015-06-27 | Discharge: 2015-06-27 | Disposition: A | Discharge status: Home / Self Care | Payer: BLUE CROSS/BLUE SHIELD | Source: Ambulatory Visit | Attending: Family Medicine | Admitting: Family Medicine

## 2015-06-27 DIAGNOSIS — Z1231 Encounter for screening mammogram for malignant neoplasm of breast: Secondary | ICD-10-CM

## 2015-08-25 ENCOUNTER — Emergency Department (HOSPITAL_COMMUNITY)
Admission: EM | Admit: 2015-08-25 | Discharge: 2015-08-25 | Disposition: A | Discharge status: Home / Self Care | Payer: BLUE CROSS/BLUE SHIELD | Source: Home / Self Care

## 2015-08-25 DIAGNOSIS — N390 Urinary tract infection, site not specified: Secondary | ICD-10-CM

## 2015-08-25 LAB — POCT URINALYSIS DIP (DEVICE)
Bilirubin Urine: NEGATIVE
GLUCOSE, UA: NEGATIVE mg/dL
KETONES UR: NEGATIVE mg/dL
Nitrite: POSITIVE — AB
PROTEIN: 100 mg/dL — AB
Specific Gravity, Urine: 1.02 (ref 1.005–1.030)
UROBILINOGEN UA: 0.2 mg/dL (ref 0.0–1.0)
pH: 5.5 (ref 5.0–8.0)

## 2015-08-25 MED ORDER — SULFAMETHOXAZOLE-TRIMETHOPRIM 800-160 MG PO TABS
1.0000 | ORAL_TABLET | Freq: Two times a day (BID) | ORAL | Status: AC
Start: 2015-08-25 — End: 2015-09-01

## 2015-08-25 NOTE — Discharge Instructions (Signed)
Antibiotic Medicine Antibiotic medicines are used to treat infections caused by bacteria. They work by hurting or killing the germs that are making you sick. HOW WILL MY MEDICINE BE PICKED? There are many kinds of antibiotic medicines. To help your doctor pick one, tell your doctor if:  You have any allergies.  You are pregnant or plan to get pregnant.  You are breastfeeding.  You are taking any medicines. These include over-the-counter medicines, prescription medicines, and herbal remedies.  You have a medical condition or problem. If you have questions about why your medicine was picked, ask. FOR HOW LONG SHOULD I TAKE MY MEDICINE? Take your medicine for as long as your doctor tells you to. Do not stop taking it when you feel better. If you stop taking it too soon:  You may start to feel sick again.  Your infection may get harder to treat.  New problems may develop. WHAT IF I MISS A DOSE? Try not to miss any doses of antibiotic medicine. If you miss a dose:  Take the dose as soon as you can.  If you are taking 2 doses a day, take the next dose in 5 to 6 hours.  If you are taking 3 or more doses a day, take the next dose in 2 to 4 hours. Then go back to the normal schedule. If you cannot take a missed dose, take the next dose on time. Then take the missed dose after you have taken all the doses as told by your doctor, as if you had one more dose left. DOES THIS MEDICINE AFFECT BIRTH CONTROL? Birth control pills may not work while you are on antibiotic medicines. If you are taking birth control pills, keep taking them as usual. Use a second form of birth control, such as a condom. Keep using the second form of birth control until you are finished with your current 1 month cycle of birth control pills. GET HELP IF:  You get worse.  You do not feel better a few days after starting the medicine.  You throw up (vomit).  There are white patches in your mouth.  You have new  joint pain after starting the medicine.  You have new muscle aches after starting the medicine.  You had a fever before starting the medicine, and it comes back.  You have any symptoms of an allergic reaction, such as an itchy rash. If this happens, stop taking the medicine. GET HELP RIGHT AWAY IF:  Your pee (urine) turns dark or becomes blood-colored.  Your skin turns yellow.  You bruise or bleed easily.  You have very bad watery poop (diarrhea) and cramps in your belly (abdomen).  You have a very bad headache.  You have signs of a very bad allergic reaction, such as:  Trouble breathing.  Wheezing.  Swelling of the lips, tongue, or face.  Fainting.  Blisters on the skin or in the mouth. If you have signs of a very bad allergic reaction, stop taking the antibiotic medicine right away.   This information is not intended to replace advice given to you by your health care provider. Make sure you discuss any questions you have with your health care provider.   Document Released: 06/22/2008 Document Revised: 06/04/2015 Document Reviewed: 01/29/2015 Elsevier Interactive Patient Education 2016 [employer] Urinary Tract Infection Urinary tract infections (UTIs) can develop anywhere along your urinary tract. Your urinary tract is your body's drainage system for removing wastes and extra water. Your urinary tract includes  two kidneys, two ureters, a bladder, and a urethra. Your kidneys are a pair of bean-shaped organs. Each kidney is about the size of your fist. They are located below your ribs, one on each side of your spine. CAUSES Infections are caused by microbes, which are microscopic organisms, including fungi, viruses, and bacteria. These organisms are so small that they can only be seen through a microscope. Bacteria are the microbes that most commonly cause UTIs. SYMPTOMS  Symptoms of UTIs may vary by age and gender of the patient and by the location of the infection.  Symptoms in [place] women typically include a frequent and intense urge to urinate and a painful, burning feeling in the bladder or urethra during urination. Older women and men are more likely to be tired, shaky, and weak and have muscle aches and abdominal pain. A fever may mean the infection is in your kidneys. Other symptoms of a kidney infection include pain in your back or sides below the ribs, nausea, and vomiting. DIAGNOSIS To diagnose a UTI, your caregiver will ask you about your symptoms. Your caregiver will also ask you to provide a urine sample. The urine sample will be tested for bacteria and white blood cells. White blood cells are made by your body to help fight infection. TREATMENT  Typically, UTIs can be treated with medication. Because most UTIs are caused by a bacterial infection, they usually can be treated with the use of antibiotics. The choice of antibiotic and length of treatment depend on your symptoms and the type of bacteria causing your infection. HOME CARE INSTRUCTIONS  If you were prescribed antibiotics, take them exactly as your caregiver instructs you. Finish the medication even if you feel better after you have only taken some of the medication.  Drink enough water and fluids to keep your urine clear or pale yellow.  Avoid caffeine, tea, and carbonated beverages. They tend to irritate your bladder.  Empty your bladder often. Avoid holding urine for long periods of time.  Empty your bladder before and after sexual intercourse.  After a bowel movement, women should cleanse from front to back. Use each tissue only once. SEEK MEDICAL CARE IF:   You have back pain.  You develop a fever.  Your symptoms do not begin to resolve within 3 days. SEEK IMMEDIATE MEDICAL CARE IF:   You have severe back pain or lower abdominal pain.  You develop chills.  You have nausea or vomiting.  You have continued burning or discomfort with urination. MAKE SURE YOU:    Understand these instructions.  Will watch your condition.  Will get help right away if you are not doing well or get worse.   This information is not intended to replace advice given to you by your health care provider. Make sure you discuss any questions you have with your health care provider.   Document Released: 06/23/2005 Document Revised: 06/04/2015 Document Reviewed: 10/22/2011 Elsevier Interactive Patient Education [employer].

## 2015-08-25 NOTE — ED Provider Notes (Signed)
CSN: [id number]     Arrival date & time 08/25/15  1742 History   None    No chief complaint on file.  (Consider location/radiation/quality/duration/timing/severity/associated sxs/prior Treatment) The history is provided by the patient. No language interpreter was used.   History obtained from patient:   LOCATION:bladder SEVERITY:2 DURATION:2 days CONTEXT:sudden onset QUALITY:uncomfortable urination MODIFYING FACTORS:none ASSOCIATED SYMPTOMS:none TIMING:constant    Past Medical History  Diagnosis Date  . Trigeminal nerve disease   . Pulmonary embolism 10/09/2012    bilateral/notes 10/09/2012 (10/10/2012)  . Exertional dyspnea 10/05/2012    (10/10/2012)  . Sleep apnea     "no mask" (10/10/2012)  . Cellulitis ?2010    "right toe" (10/10/2012)  . Uterine cancer 2009  . Right leg DVT    Past Surgical History  Procedure Laterality Date  . Abdominal hysterectomy  2009   Family History  Problem Relation Age of Onset  . Diabetes Mother   . Heart attack Father    Social History  Substance Use Topics  . Smoking status: Former Smoker -- 0.75 packs/day for 17 years    Types: Cigarettes    Quit date: 01/23/2008  . Smokeless tobacco: Never Used  . Alcohol Use: Yes     Comment: rarely last drink was 1 glass of wine 3 weeks ago   OB History    No data available     Review of Systems ROS +'ve dysuria  Denies: HEADACHE, NAUSEA, ABDOMINAL PAIN, CHEST PAIN, CONGESTION,   SHORTNESS OF BREATH  Allergies  Review of patient's allergies indicates no known allergies.  Home Medications   Prior to Admission medications   Medication Sig Start Date End Date Taking? Authorizing Provider  albuterol (PROVENTIL HFA;VENTOLIN HFA) 108 (90 BASE) MCG/ACT inhaler Inhale 2 puffs into the lungs 4 (four) times daily. 01/03/14   Harden Mo, MD  azithromycin (ZITHROMAX Z-PAK) 250 MG tablet Take as directed. 01/03/14   Harden Mo, MD  chlorpheniramine-HYDROcodone (TUSSIONEX) 10-8 MG/5ML LQCR  Take 5 mLs by mouth every 12 (twelve) hours as needed for cough. 01/03/14   Harden Mo, MD  colchicine 0.6 MG tablet Take two tabs by mouth at one time and then a third tablet 1 hour later 07/13/14   Lutricia Feil, PA  HYDROcodone-acetaminophen (NORCO/VICODIN) 5-325 MG per tablet Take 1 tablet by mouth every 6 (six) hours as needed. 10/09/13   Gregor Hams, MD  indomethacin (INDOCIN) 25 MG capsule Take 1 capsule (25 mg total) by mouth 3 (three) times daily with meals. 10/09/13   Gregor Hams, MD  predniSONE (DELTASONE) 20 MG tablet Take 1 tablet (20 mg total) by mouth 2 (two) times daily. 01/03/14   Harden Mo, MD  traMADol (ULTRAM) 50 MG tablet Take 2 tablets (100 mg total) by mouth every 8 (eight) hours as needed. 01/03/14   Harden Mo, MD   Meds Ordered and Administered this Visit  Medications - No data to display  There were no vitals taken for this visit. No data found.   Physical Exam  Constitutional: She is oriented to person, place, and time. She appears well-developed and well-nourished. No distress.  HENT:  Head: Normocephalic and atraumatic.  Pulmonary/Chest: Effort normal and breath sounds normal.  Abdominal: Soft. Bowel sounds are normal. She exhibits no distension. There is no tenderness. There is no rebound and no guarding.  No CVA-T  Musculoskeletal: Normal range of motion.  Neurological: She is alert and oriented to person, place, and time.  Skin: Skin is warm and dry.  Psychiatric: She has a normal mood and affect. Her behavior is normal. Judgment and thought content normal.  Nursing note and vitals reviewed.   ED Course  Procedures (including critical care time)  Labs Review Labs Reviewed  POCT URINALYSIS DIP (DEVICE) - Abnormal; Notable for the following:    Hgb urine dipstick MODERATE (*)    Protein, ur 100 (*)    Nitrite POSITIVE (*)    Leukocytes, UA LARGE (*)    All other components within normal limits    Imaging Review No results  found.   Visual Acuity Review  Right Eye Distance:   Left Eye Distance:   Bilateral Distance:    Right Eye Near:   Left Eye Near:    Bilateral Near:         MDM   1. UTI (lower urinary tract infection)    UA is positive. Rx for Bactrim DS. Patient not having a lot of pain, more frequency. Advised to follow up if fever, chills, back pain.    Konrad Felix, [place] 08/26/15 [phone]

## 2016-01-31 IMAGING — MG MM SCREEN MAMMOGRAM BILATERAL
4 series · 4 of 4 positions shown · non-contrast
Comparison: Previous exam(s).

ACR Breast Density Category a: The breast tissue is almost entirely
fatty.

CLINICAL DATA: Screening.

EXAM:
DIGITAL SCREENING BILATERAL MAMMOGRAM WITH CAD

[R CC]
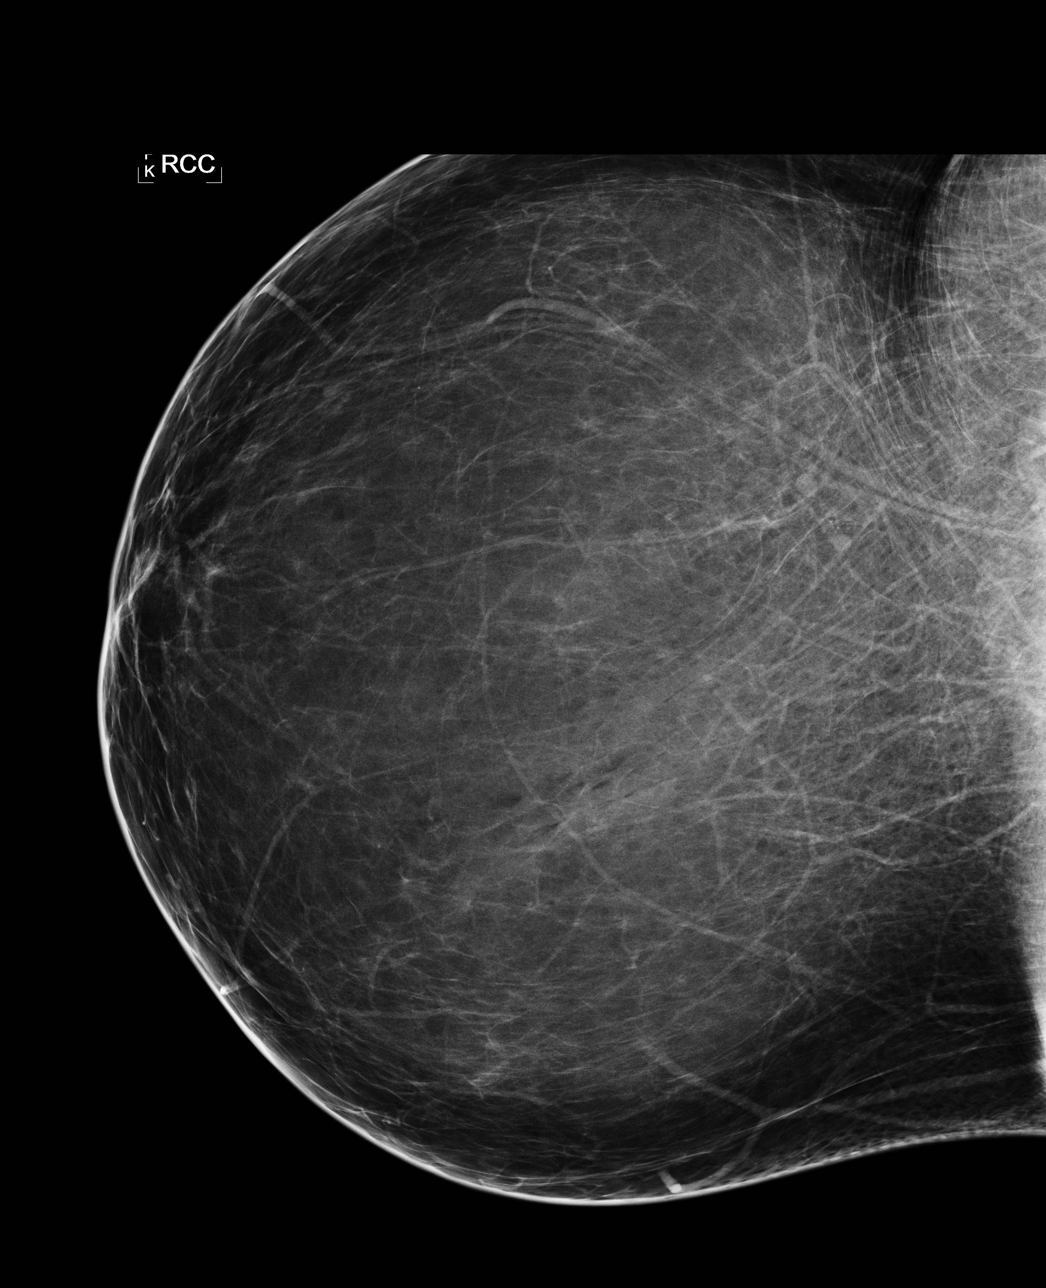

[L CC]
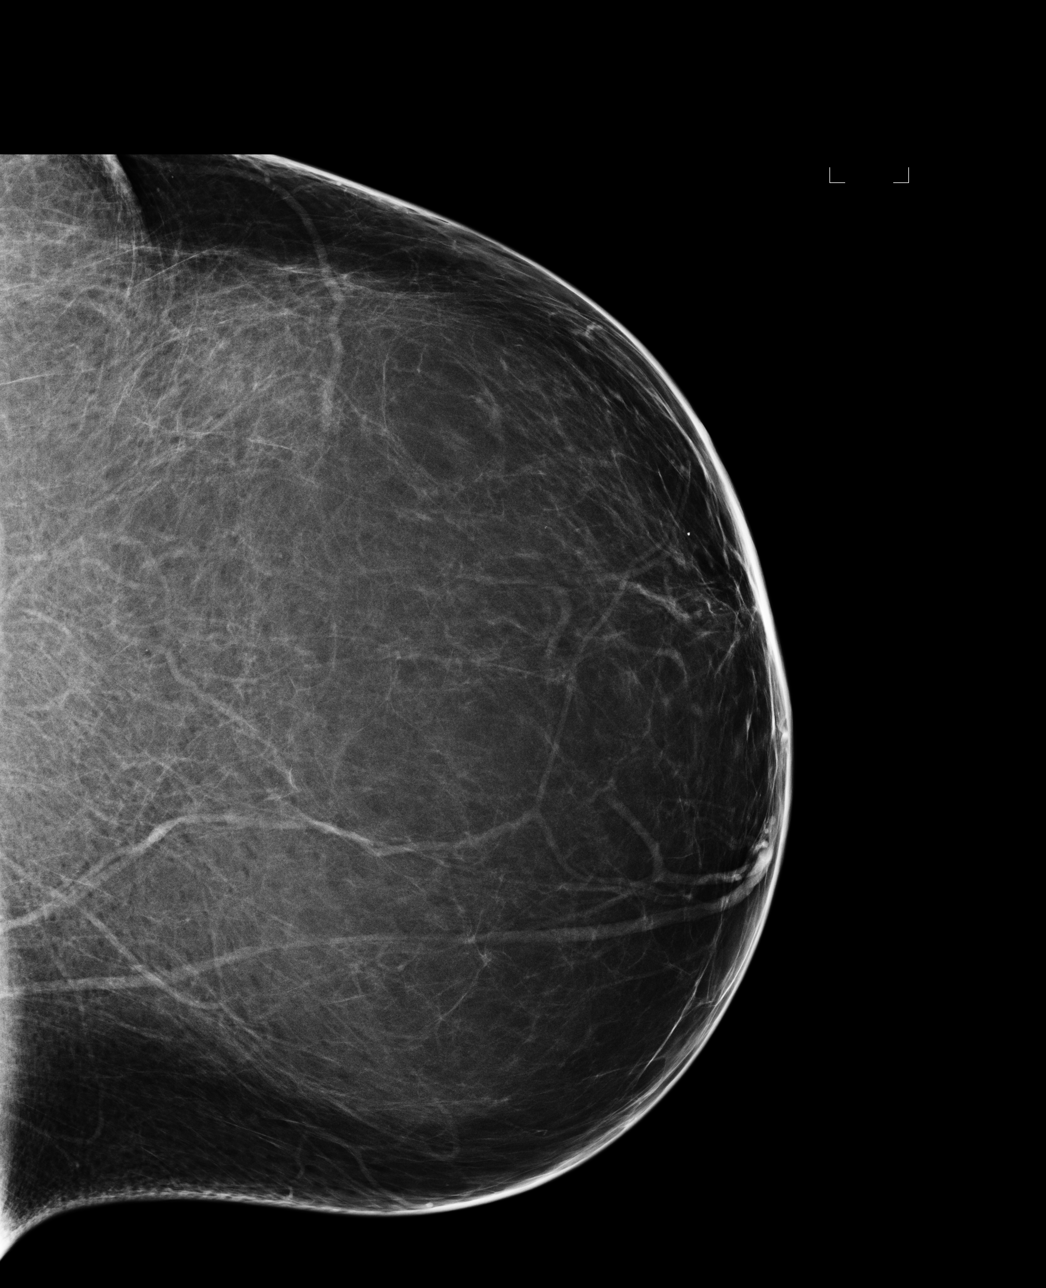

[L MLO]
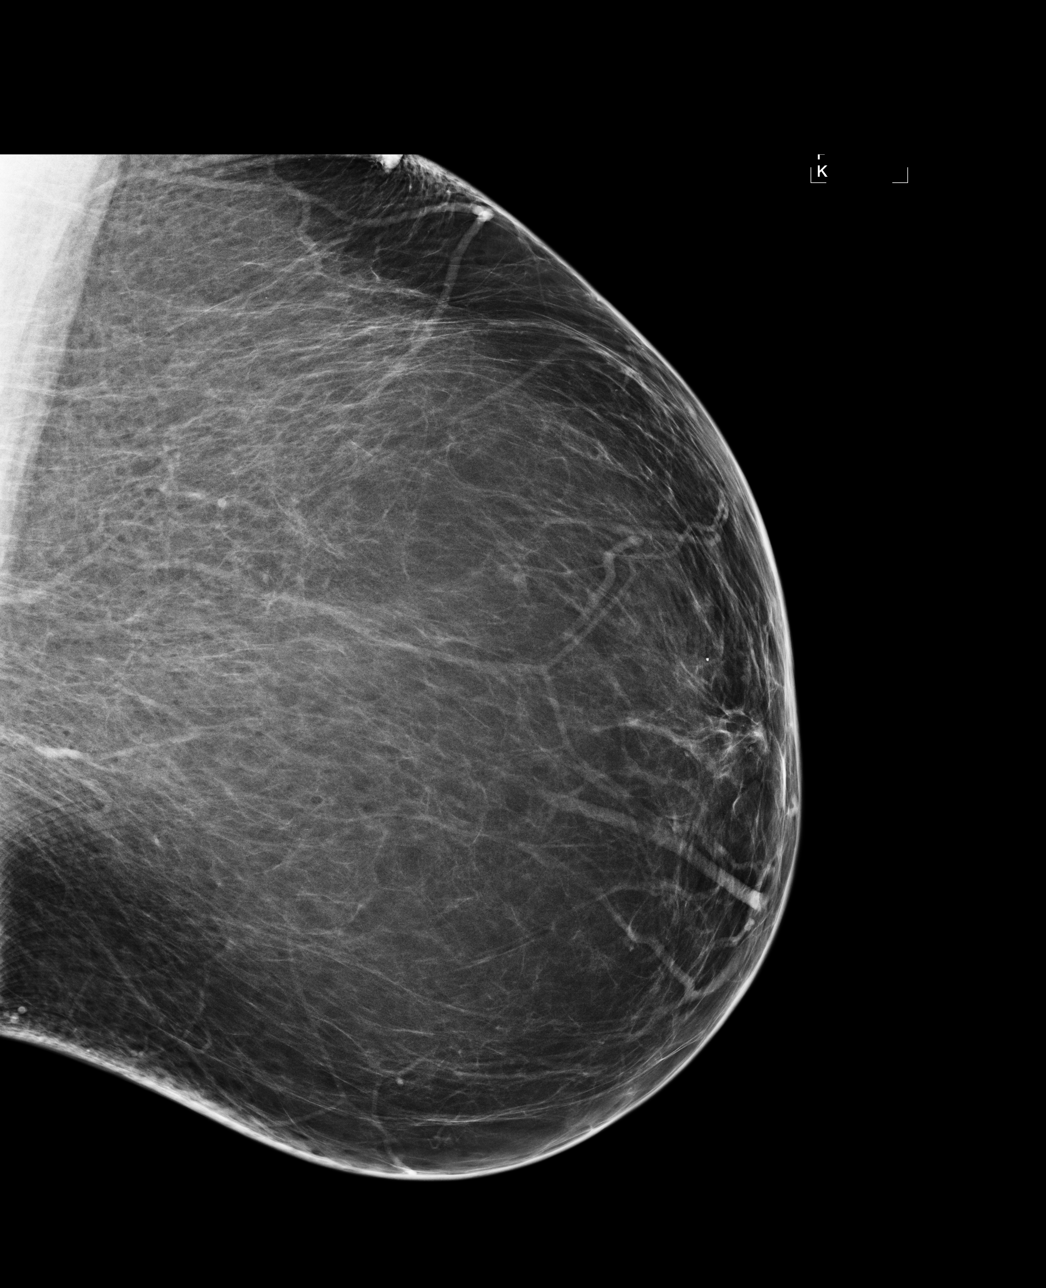

[R MLO]
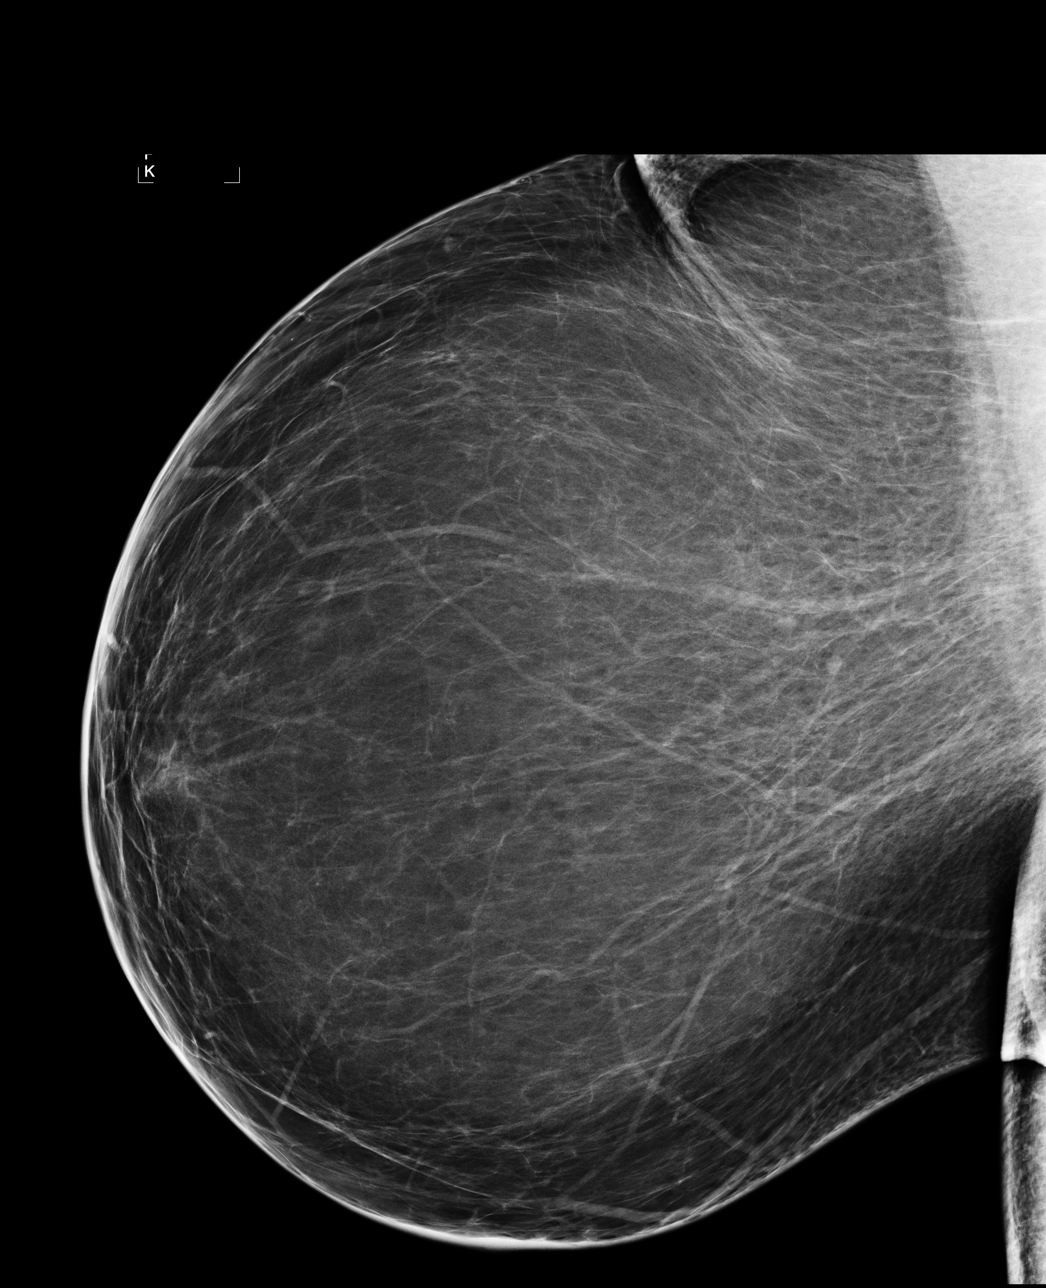

[4 of 4 positions shown; findings below may reference images not displayed]

FINDINGS: There are no findings suspicious for malignancy. Images were
processed with CAD.
IMPRESSION: No mammographic evidence of malignancy. A result letter of this
screening mammogram will be mailed directly to the patient.

RECOMMENDATION:
Screening mammogram in one year. (Code:[id number])

BI-RADS CATEGORY  1: Negative.

## 2016-07-06 ENCOUNTER — Other Ambulatory Visit: Payer: Self-pay | Admitting: Family Medicine

## 2016-07-06 DIAGNOSIS — R58 Hemorrhage, not elsewhere classified: Secondary | ICD-10-CM

## 2016-07-14 ENCOUNTER — Ambulatory Visit
Admission: RE | Admit: 2016-07-14 | Discharge: 2016-07-14 | Disposition: A | Discharge status: Home / Self Care | Payer: BLUE CROSS/BLUE SHIELD | Source: Ambulatory Visit | Attending: Family Medicine | Admitting: Family Medicine

## 2016-07-14 DIAGNOSIS — R58 Hemorrhage, not elsewhere classified: Secondary | ICD-10-CM

## 2017-02-17 IMAGING — US US TRANSVAGINAL NON-OB
1 series · 14 of 25 positions shown · non-contrast
Comparison: None

CLINICAL DATA: Bleeding post hysterectomy and BILATERAL
oophorectomy, spotting for 2-3 weeks, cramping, history endometrial
cancer

EXAM:
TRANSABDOMINAL AND TRANSVAGINAL ULTRASOUND OF PELVIS
TECHNIQUE: Both transabdominal and transvaginal ultrasound examinations of the
pelvis were performed. Transabdominal technique was performed for
global imaging of the pelvis including uterus, ovaries, adnexal
regions, and pelvic cul-de-sac. It was necessary to proceed with
endovaginal exam following the transabdominal exam to visualize the
pelvic structures.

[Series 1: us transvaginal non-ob · 0.33mm/px · 14 of 33 slices shown]
[im 1/33]
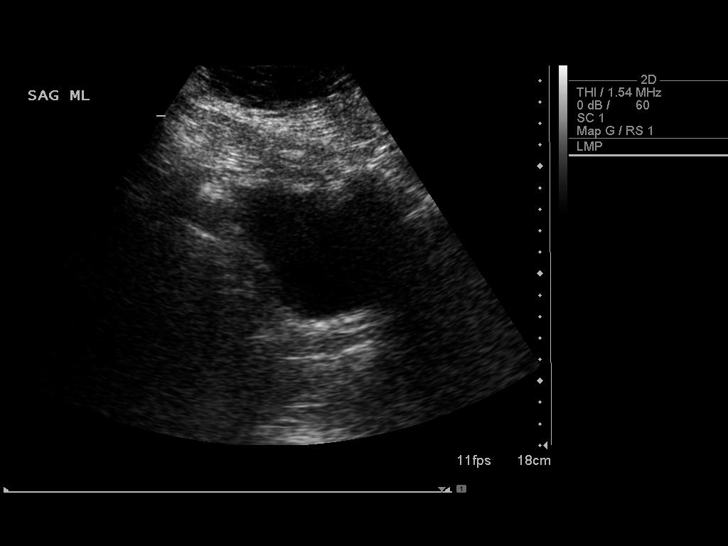
[im 3/33]
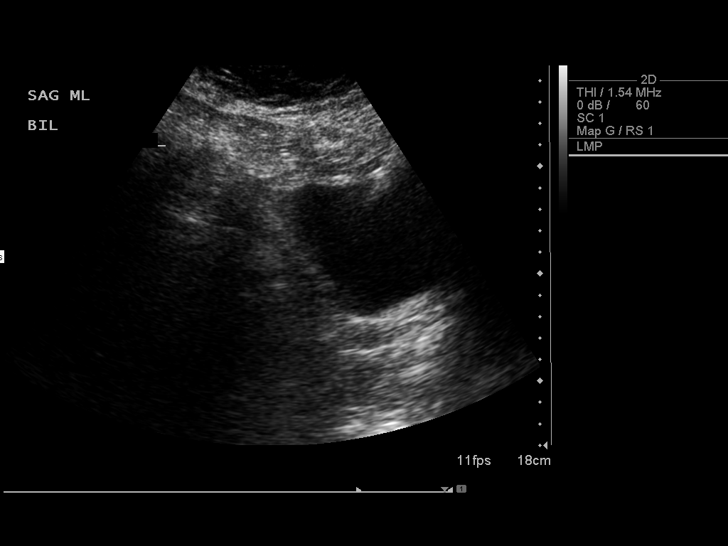
[im 6/33]
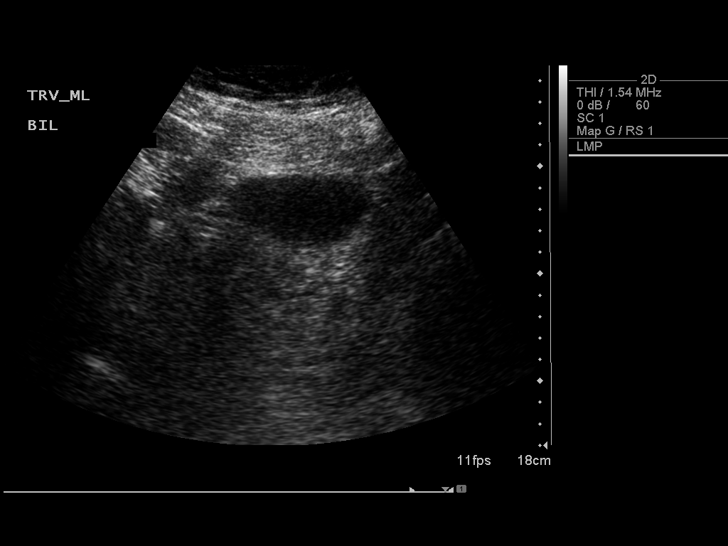
[im 9/33]
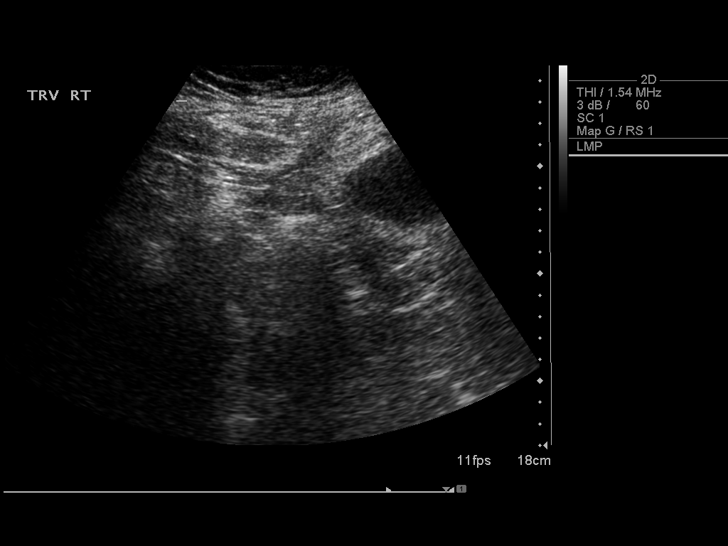
[im 11/33]
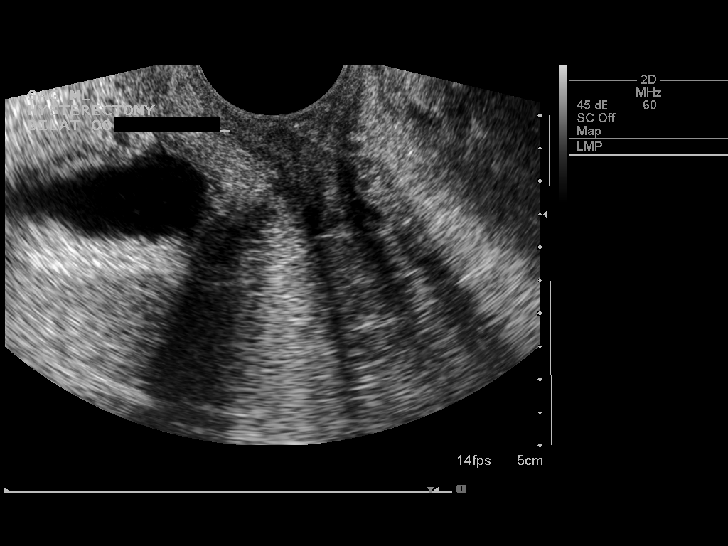
[im 13/33]
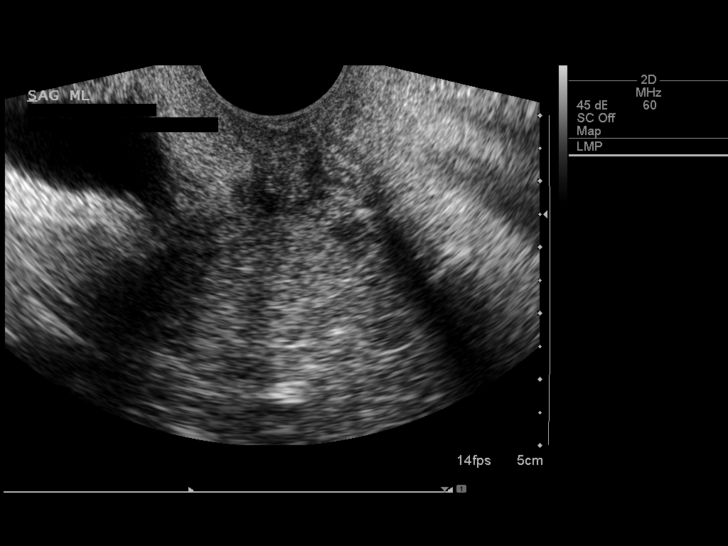
[im 15/33]
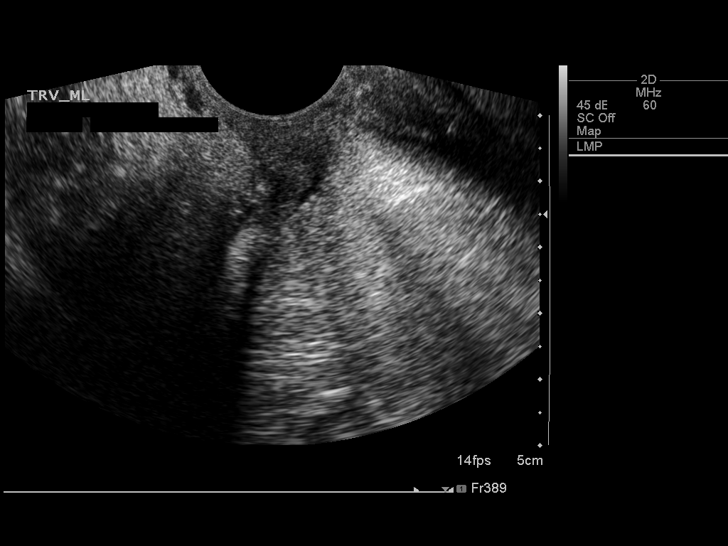
[im 18/33]
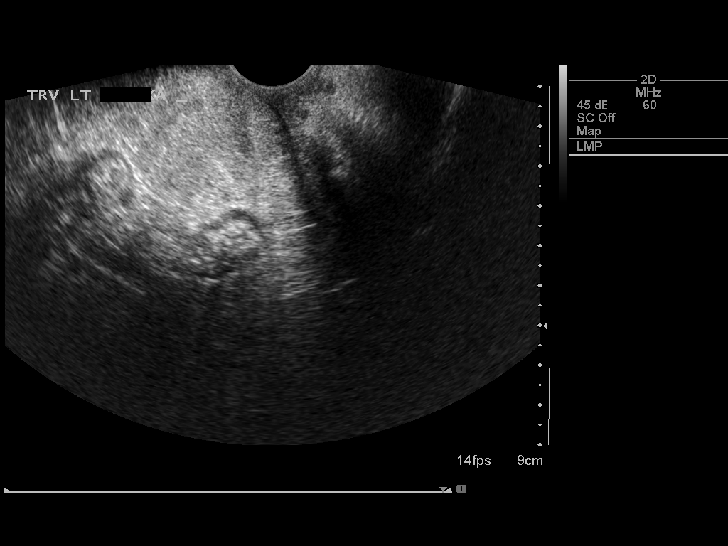
[im 21/33]
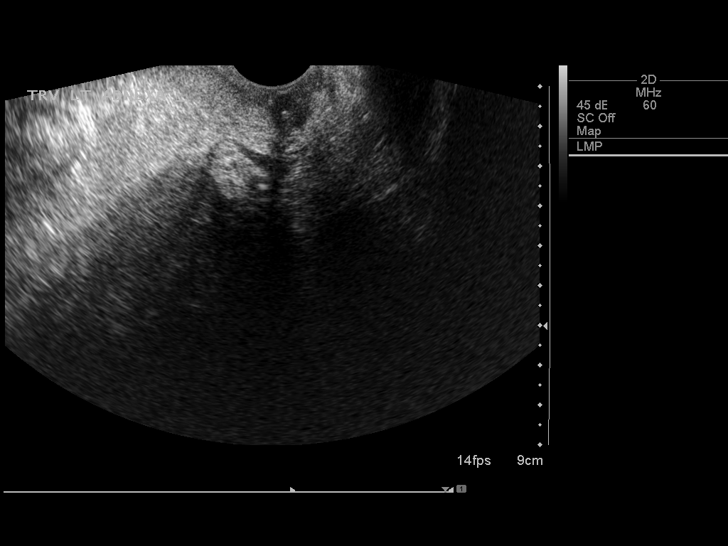
[im 22/33]
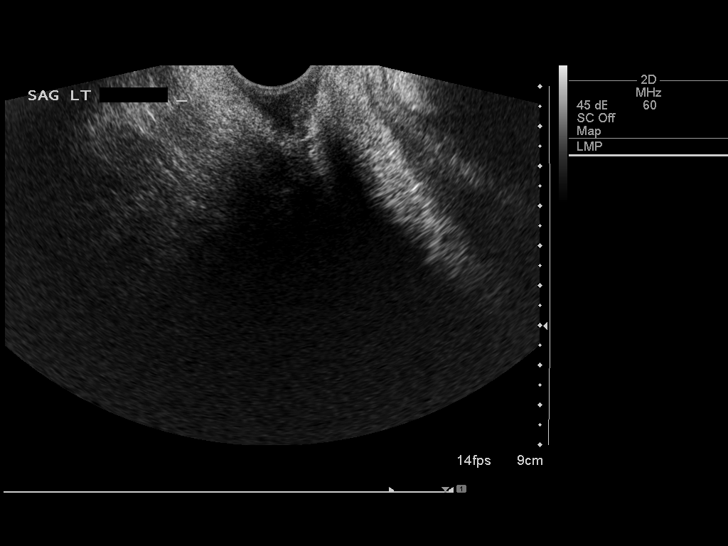
[im 25/33]
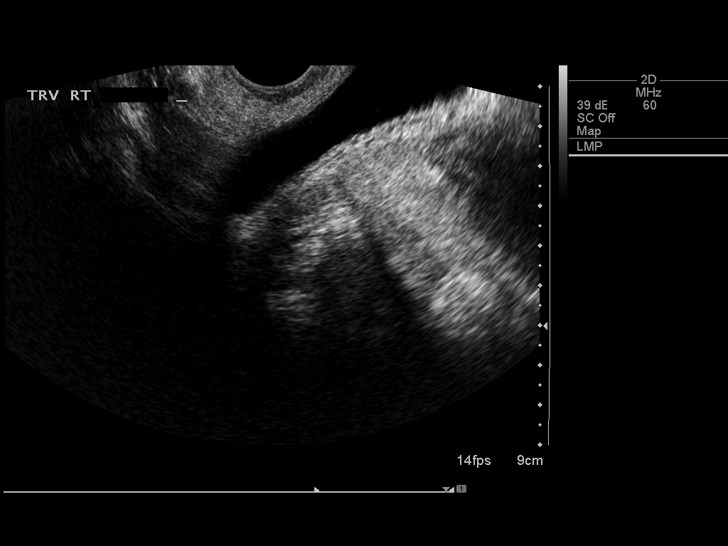
[im 27/33]
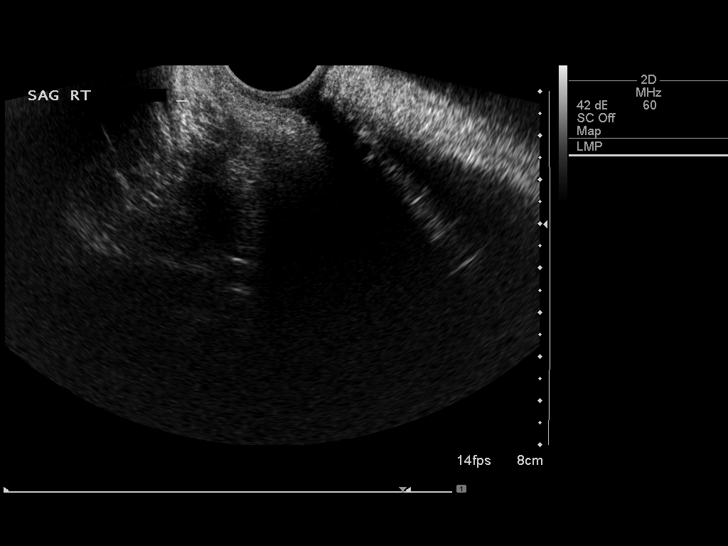
[im 30/33]
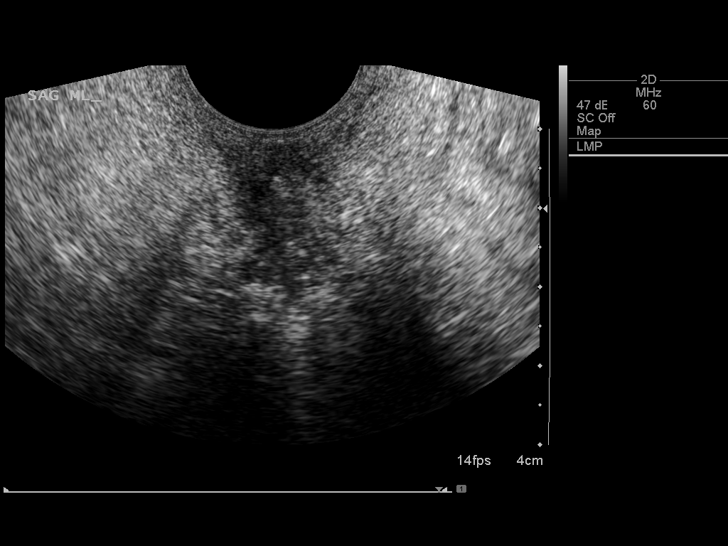
[im 33/33]
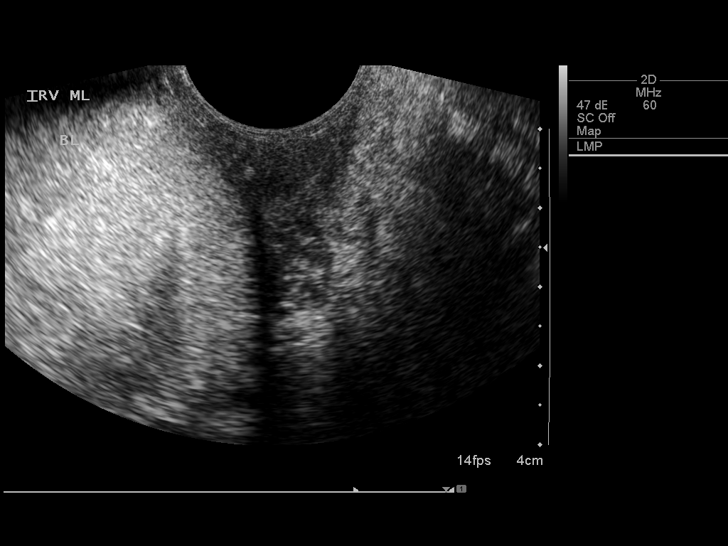

[14 of 25 positions shown; findings below may reference images not displayed]

FINDINGS: Uterus

Surgically absent

Endometrium

N/A

Right ovary

Surgically absent

Left ovary

Surgically absent

Other findings

Visualized portion of urinary bladder normal appearance. No free
pelvic fluid. No solid or cystic pelvic masses are identified.
IMPRESSION: Post hysterectomy and BILATERAL oophorectomy.

No sonographic abnormalities visualized.

## 2018-04-11 ENCOUNTER — Encounter: Payer: Self-pay | Admitting: Neurology

## 2018-04-12 ENCOUNTER — Ambulatory Visit: Payer: BLUE CROSS/BLUE SHIELD | Admitting: Neurology

## 2018-04-12 ENCOUNTER — Encounter: Payer: Self-pay | Admitting: Neurology

## 2018-04-12 VITALS — BP 138/89 | HR 79 | Ht 65.0 in | Wt 380.0 lb

## 2018-04-12 DIAGNOSIS — G4733 Obstructive sleep apnea (adult) (pediatric): Secondary | ICD-10-CM

## 2018-04-12 DIAGNOSIS — Z6841 Body Mass Index (BMI) 40.0 and over, adult: Secondary | ICD-10-CM

## 2018-04-12 DIAGNOSIS — R942 Abnormal results of pulmonary function studies: Secondary | ICD-10-CM | POA: Diagnosis not present

## 2018-04-12 DIAGNOSIS — E669 Obesity, unspecified: Secondary | ICD-10-CM | POA: Insufficient documentation

## 2018-04-12 DIAGNOSIS — E662 Morbid (severe) obesity with alveolar hypoventilation: Secondary | ICD-10-CM

## 2018-04-12 DIAGNOSIS — H4711 Papilledema associated with increased intracranial pressure: Secondary | ICD-10-CM | POA: Insufficient documentation

## 2018-04-12 DIAGNOSIS — I10 Essential (primary) hypertension: Secondary | ICD-10-CM | POA: Diagnosis not present

## 2018-04-12 NOTE — Progress Notes (Signed)
SLEEP MEDICINE CLINIC   Provider:  Larey Seat, M D  Primary Care Physician:  Hayden Rasmussen, MD   Referring Provider: Dr. Albertina Parr, Maebelle Munroe, MD    Chief Complaint  Patient presents with  . New Patient (Initial Visit)    Room 10. Pt here alone. Pt was followed by Dr. Krista Blue and referred for PSG , had a sleep study here in 08-11-2012, diagnosed with OSA at [facility] but could not afford CPAP at the time, remained untreated. Morbidly obese, quit smoking 2009 , now wants CPAP.     HPI:  Shirley Schmidt is a 47 y.o. female , seen here as in a referral  from Dr. Darron Doom for a new sleep evaluation,  I had the pleasure of meeting with Mrs. Locascio in the year 2013.  I recaptured briefly what happened at the time, my colleague Dr. Rockne Menghini had referred the patient was a counselor survivor at the time suffered from essential hypertension, super obesity, nocturia insomnia daytime sleepiness and had endorsed the Epworth Sleepiness Scale at 16 out of 24 points at the [facility] at 11 points with a BMI at the time of 52.  She also was on hydrocodone-acetaminophen at the time which showed a post risk of inducing sleep apnea as well.  The baseline study documented 166 respiratory events 6 obstructive, 7 central apneas and 153 hyper apneas.  There were also additional respiratory event related arousals related to loud snoring.  The AHI was 76.6, the REM AHI was 82, the supine AHI 120/h of sleep.  Desaturation index was 76/h SPO2 under 90% was 27 minutes, with a nadir of 78%, there were no periodic limb movements.  The patient was titrated to a CPAP pressure of 9 cm with an AHI of 0.  Again she could not afford CPAP at the time  Today is 12 April 2018 and Mrs. Urenda presents now with bariatric needs having reached a super obesity BMI of 64.2, baseline blood pressure in the 161W and 960A systolic, heart rate elevated over 80 at rest, she is a survivor of uterine cancer but has completed all her  therapies, she has some back pain, she is excessively daytime sleepy which interferes with her ability to drive, to concentrate at work to stay alert.  She endorsed snoring, joint pain frequent infections, insomnia, daytime sleepiness, snoring and restless legs.  The patient had undergone a hysterectomy in 2009 related to the uterine cancer.  She is now taking gout medication, allopurinol, she is taking Wellbutrin in the morning chlorthalidone for hypertension control gabapentin for neuropathy . She had a DVT in 2014 and was 6 month on Xarelto .  Sleep habits are as follows: she goes to bed between 11PM and midnight- describes her bedroom as cool, quiet and dark and she needs on average 20 minutes to go to sleep. She has 2-3 nocturias. Some nights she sleeps through, most nights she wakes hourly. Has heart burn, orthopnea, avoids prone sleep and sleeping flat. Sleeps on 2 pillows. She dreams. Bed parter reports her loud snoring and tossing and turning, being restless, talking in her sleep. No sleepwalking. No dream enactment.  She sets 3 alarms, the first at 6.50- then 7.15 - by 7.30 she rises.  She feels less refreshed and restored from her sleep on weekdays when she works, she sleeps in a little longer on weekends and feels more refreshed.  She wakes up with a very dry mouth, sometimes has bowel aching  throbbing morning headaches, she does not report headaches waking her out of sleep.  She averages between 6 and 7 hours of sleep.  She tends to nap in daytime on weekends when she has time but she also has fall asleep at her desk at work.  She has the irresistible urge to go to sleep.   Sleep medical history and family sleep history: Super obesity, overweight al her life, has on all diets and appetite suppressants . Gout, wellbutrin for food craving. Prepares for bariatric surgery, gastric sleeve is planned. Had a DVT.  Parents were not diagnosed with OSA, but she suspects they have it- 2 half siblings,  obese, too.    Social history:  Single, full time gainfully employed. Non smoker since 10 years ago, ETOH- rarely- wine on weekends. Caffeine use - coffee 3 times a week, caffeinated diet sodas every day 2  Bev, Coke zero.  Remote histoyr of night shift work, second shift, now on first shift for 10 years.    Review of Systems: Out of a complete 14 system review, the patient complains of only the following symptoms, and all other reviewed systems are negative.  Snoring, nocturia, fatigue , EDS, weight gain, gout in big toe.   Epworth Sleepiness score endorsed at  20/ 24  , Fatigue severity score 49  , depression score deferred- denies depression.   Social History   Socioeconomic History  . Marital status: Single    Spouse name: Not on file  . Number of children: Not on file  . Years of education: Not on file  . Highest education level: Not on file  Occupational History  . Not on file  Social Needs  . Financial resource strain: Not on file  . Food insecurity:    Worry: Not on file    Inability: Not on file  . Transportation needs:    Medical: Not on file    Non-medical: Not on file  Tobacco Use  . Smoking status: Former Smoker    Packs/day: 0.75    Years: 17.00    Pack years: 12.75    Types: Cigarettes    Last attempt to quit: 01/23/2008    Years since quitting: 10.2  . Smokeless tobacco: Never Used  Substance and Sexual Activity  . Alcohol use: Yes    Comment: rarely last drink was 1 glass of wine 3 weeks ago  . Drug use: Yes    Types: Marijuana    Comment: once a week  . Sexual activity: Not Currently  Lifestyle  . Physical activity:    Days per week: Not on file    Minutes per session: Not on file  . Stress: Not on file  Relationships  . Social connections:    Talks on phone: Not on file    Gets together: Not on file    Attends religious service: Not on file    Active member of club or organization: Not on file    Attends meetings of clubs or organizations:  Not on file    Relationship status: Not on file  . Intimate partner violence:    Fear of current or ex partner: Not on file    Emotionally abused: Not on file    Physically abused: Not on file    Forced sexual activity: Not on file  Other Topics Concern  . Not on file  Social History Narrative  . Not on file    Family History  Problem Relation Age of Onset  .  Diabetes Mother   . Heart attack Father     Past Medical History:  Diagnosis Date  . Cellulitis ?2010   "right toe" (10/10/2012)  . Exertional dyspnea 10/05/2012   (10/10/2012)  . Gout   . Pulmonary embolism ([place]) 10/09/2012   bilateral/notes 10/09/2012 (10/10/2012)  . Right leg DVT ([place])   . Sleep apnea    "no mask" (10/10/2012)  . Trigeminal nerve disease   . Uterine cancer ([place]) 2009    Past Surgical History:  Procedure Laterality Date  . ABDOMINAL HYSTERECTOMY  2009    Current Outpatient Medications  Medication Sig Dispense Refill  . allopurinol (ZYLOPRIM) 300 MG tablet Take 300 mg by mouth 2 (two) times daily.    Marland Kitchen buPROPion (WELLBUTRIN XL) 300 MG 24 hr tablet   0  . chlorthalidone (HYGROTON) 25 MG tablet Take 25 mg by mouth daily.    . metFORMIN (GLUCOPHAGE-XR) 500 MG 24 hr tablet   0   No current facility-administered medications for this visit.     Allergies as of 04/12/2018  . (No Known Allergies)    Vitals: BP 138/89   Pulse 79   Ht 5' 5"  (1.651 m)   Wt (!) 380 lb (172.4 kg)   BMI 63.24 kg/m  Last Weight:  Wt Readings from Last 1 Encounters:  04/12/18 (!) 380 lb (172.4 kg)   LDJ:TTSV mass index is 63.24 kg/m.     Last Height:   Ht Readings from Last 1 Encounters:  04/12/18 5' 5"  (1.651 m)  RR 18/min.   Physical exam:  General: The patient is awake, alert and appears not in acute distress. The patient is well groomed. Head: Normocephalic, atraumatic. Neck is supple. Mallampati 5 , low and narrow- very small. ,  neck circumference:20.75. Nasal airflow patent ,Retrognathia is not seen.    Cardiovascular:  Regular rate and rhythm , without  murmurs or carotid bruit, and without distended neck veins. Respiratory: Lungs are clear to auscultation. Skin:  Without evidence of edema, or rash Trunk: BMI is 63. 4.  Neurologic exam : The patient is awake and alert, oriented to place and time.   Attention span & concentration ability appears normal.  Speech is fluent,  without dysarthria, dysphonia or aphasia.  Mood and affect are appropriate.  Cranial nerves: Pupils are equal and briskly reactive to light. Funduscopic exam without evidence of pallor but there is mild retro-papillary edema. Extraocular movements  in vertical and horizontal planes intact and without nystagmus.  Visual fields by finger perimetry are intact.Hearing to finger rub intact. Swimmer's ear-   Facial sensation intact to fine touch. Facial motor strength is symmetric, her tongue is midline. Very short thick neck with limited ROM. Shoulder shrug was symmetrical.   Motor exam: Normal tone, muscle bulk and symmetric strength in all extremities. Sensory:  Fine touch, pinprick and vibration were tested in all extremities. Proprioception tested in the upper extremities was normal. Coordination: Rapid alternating movements in the fingers/hands/ Finger-to-nose maneuver normal without evidence of ataxia, dysmetria or tremor. She has difficulties threading a needle , her handwriting is unchanged.  Gait and station: Patient walks wide based.  Stance is wide based.  Deep tendon reflexes: in the  upper and lower extremities are symmetric and intact.   Assessment:  After physical and neurologic examination, review of laboratory studies,  Personal review of imaging studies, reports of other /same  Imaging studies, results of polysomnography and / or neurophysiology testing and pre-existing records as far as provided in  visit., my assessment is   1)  High risk of obesity hypoventilation in a patient with super-obesity,  papilledema, morning headaches and daytime fatigue, baseline RR and heart rate are elevated, shortness of breath is felt with minimal exertion.     2)Has factor 5 leiden and developed DVT, PE s- these may have further decreased her pulmonary capacity.  Main symptoms are :Severe daytime sleepiness.  Co-morbidities are gout, HTN, morning headaches, DVT and PE.   Plan 1) repeat sleep study is needed, this needs to be a SPLIT with capnography and hypoxia documentation prior to bariatric surgery. The patient may need oxygen if PAP is not able to correct hypoxemia.  The patient's SPLIT study should be initiated after 60-120 minutes of sleep if AHI reached 40.   2) RV soon after sleep study to prepare for bariatric follow up. Patient may benefit from medical weight management - metabolic rate determination.    The patient was advised of the nature of the diagnosed disorder , the treatment options and the  risks for general health and wellness arising from not treating the condition.   I spent more than 55 minutes of face to face time with the patient.  Greater than 50% of time was spent in counseling and coordination of care. We have discussed the diagnosis and differential and I answered the patient's questions    Larey Seat, MD 7/74/1423, 95:32 AM  Certified in Neurology by ABPN Certified in [place] by [facility] [address] [place], [place] [address]

## 2018-04-28 ENCOUNTER — Ambulatory Visit (INDEPENDENT_AMBULATORY_CARE_PROVIDER_SITE_OTHER): Payer: BLUE CROSS/BLUE SHIELD | Admitting: Neurology

## 2018-04-28 DIAGNOSIS — Z6841 Body Mass Index (BMI) 40.0 and over, adult: Principal | ICD-10-CM

## 2018-04-28 DIAGNOSIS — R942 Abnormal results of pulmonary function studies: Secondary | ICD-10-CM

## 2018-04-28 DIAGNOSIS — E662 Morbid (severe) obesity with alveolar hypoventilation: Secondary | ICD-10-CM

## 2018-04-28 DIAGNOSIS — E669 Obesity, unspecified: Secondary | ICD-10-CM

## 2018-04-28 DIAGNOSIS — H4711 Papilledema associated with increased intracranial pressure: Secondary | ICD-10-CM

## 2018-04-28 DIAGNOSIS — I10 Essential (primary) hypertension: Secondary | ICD-10-CM

## 2018-04-28 DIAGNOSIS — G4733 Obstructive sleep apnea (adult) (pediatric): Secondary | ICD-10-CM | POA: Diagnosis not present

## 2018-05-02 NOTE — Procedures (Signed)
PATIENT'S NAME:  Shirley Schmidt, Ju DOB:      Nov 12, 1970      MRN#:              [id number]     DATE OF RECORDING: 04/28/2018 REFERRING M.D.:  Horald Pollen, M.D. and Dr. Krista Blue.  Study Performed:  Split-Night Titration Study HISTORY:  Shirley Schmidt presents now with bariatric needs having reached a super obesity BMI of 64.2, baseline blood pressure in the 694H and 038U systolic, heart rate elevated over 80 at rest, she is a survivor of uterine cancer but has completed all her cancer therapies, she has some back pain, she is excessively daytime sleepy which interferes with her ability to drive, to concentrate at work to stay alert.  She endorsed snoring, joint pain frequent infections, insomnia, daytime sleepiness, snoring and restless legs. Cellulitis, Exertional dyspnea, Gout, Pulmonary embolism, Right leg DVT, OSA, Trigeminal nerve disease, Uterine cancer. The patient endorsed the Epworth Sleepiness Scale at 20/24 points   The patient's weight 380 pounds with a height of 65 (inches), resulting in a BMI of 63.2 kg/m2. The patient's neck circumference measured 20.8 inches.  CURRENT MEDICATIONS: Zyloprim, Wellbutrin, Hygroton, Glucophage,  PROCEDURE:  This is a multichannel digital polysomnogram utilizing the Somnostar 11.2 system.  Electrodes and sensors were applied and monitored per AASM Specifications.   EEG, EOG, Chin and Limb EMG, were sampled at 200 Hz.  ECG, Snore and Nasal Pressure, Thermal Airflow, Respiratory Effort, CPAP Flow and Pressure, Oximetry was sampled at 50 Hz. Digital video and audio were recorded.      BASELINE STUDY WITHOUT CPAP RESULTS:  Lights Out was at 22:59 and Lights On at 05:01.  Total recording time (TRT) was 116.5, with a total sleep time (TST) of 67 minutes.   The patient's sleep latency was 37 minutes.  REM latency was 0 minutes.  The sleep efficiency was 57.5 %.    SLEEP ARCHITECTURE: WASO (Wake after sleep onset) was 5 minutes, Stage N1 was 17.5 minutes, Stage N2 was 42  minutes, Stage N3 was 7.5 minutes and Stage R (REM sleep) was 0 minutes.  The percentages were Stage N1 26.1%, Stage N2 62.7%, Stage N3 11.2% and Stage R (REM sleep) 0%.   RESPIRATORY ANALYSIS:  There were a total of 141 respiratory events:  40 obstructive apneas, 0 central apneas and 0 mixed apneas with a total of 40 apneas and an apnea index (AI) of 35.8. There were 101 hypopneas with a hypopnea index of 90.4. The patient also had 0 respiratory event related arousals (RERAs).  Snoring was noted.   The total APNEA/HYPOPNEA INDEX (AHI) was 126.3 /hour and the total RESPIRATORY DISTURBANCE INDEX was 126.3 /hour.  0 events occurred in REM sleep and 202 events in NREM. The REM AHI was 0, /hour versus a non-REM AHI of 126.3 /hour. The patient spent 124.5 minutes sleep time in the supine position 159 minutes in non-supine. The supine AHI was 125.8 /hour versus a non-supine AHI of 124.4 /hour.  OXYGEN SATURATION & C02:  The wake baseline 02 saturation was 89%, with the lowest being 67%. Time spent below 89% saturation equaled 67 minutes.  Overall, the average End Tidal CO2 during sleep was 45.7 torr. Peak End Tidal CO2 during sleep was during NREM at 51 torr. The total time above 50 torr was 2 minutes (not consecutive).    PERIODIC LIMB MOVEMENTS: The patient had a total of 0 Periodic Limb Movements. The arousals were noted as: 1 were spontaneous, 0 were  associated with PLMs, and 96 were associated with respiratory events.  Audio and video analysis did not show any abnormal or unusual movements, behaviors, phonations or vocalizations. The patient took bathroom breaks. Snoring was noted. EKG was in keeping with normal sinus rhythm (NSR).  TITRATION STUDY WITH CPAP RESULTS: CPAP was initiated at 5 cmH20 with heated humidity per AASM split night standards and pressure was advanced to 15 cmH20 because of hypopneas, apneas and desaturations. 2 liters of 02 was added.  BiPAP was tried at 17/13 cm and did not have  an added benefit to AHI or oxygen nadir.   At a PAP pressure of 15 cmH20, there was a reduction of the AHI to 0.0 /hour but the 02 nadir stayed at 77%. Total recording time (TRT) was 246 minutes, with a total sleep time (TST) of 216 minutes. The patient's sleep latency was 11.5 minutes. REM latency was 71 minutes.  The sleep efficiency was 87.8 %.    SLEEP ARCHITECTURE: Wake after sleep was 18.5 minutes, Stage N1 7.5 minutes, Stage N2 60 minutes, Stage N3 73.5 minutes and Stage R (REM sleep) 75 minutes. The percentages were: Stage N1 3.5%, Stage N2 27.8%, Stage N3 34.% and Stage R (REM sleep) 34.7%. The sleep architecture was notable for REM rebound under CPAP and oxygen supplementation of 2 liters.  RESPIRATORY ANALYSIS: There were a total of 30 respiratory events: 0 apneas and 30 hypopneas without respiratory event related arousals (RERAs). The total APNEA/HYPOPNEA INDEX (AHI) was 8.3 /hour and the total RESPIRATORY DISTURBANCE INDEX was 8.3 /hour. 7 events occurred in REM sleep and 23 events in NREM. The REM AHI was 5.6 /hour versus a non-REM AHI of 9.8/hour. REM sleep was achieved on a pressure of 13 cm.  The patient spent 48% of total sleep time in the supine position. The supine AHI was 17.3 /hour, versus a non-supine AHI of 0.0/ hour.  OXYGEN SATURATION & C02:  The wake baseline 02 saturation was 89%, with the lowest being 76%. Time spent below 89% saturation equaled 100 minutes, 02 was supplemented at 2 liters.   PERIODIC LIMB MOVEMENTS:   The patient had a total of 0 Periodic Limb Movements. The arousals were noted as: 9 were spontaneous, 0 were associated with PLMs, and 14 were associated with respiratory events.  POLYSOMNOGRAPHY IMPRESSION :   1. Severest Obstructive Sleep Apnea(OSA) with an AHI of 126/h and severe hypoxemia and hypercapnia-  2. 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 3. Patient responded well to CPAP at 13, 14 and 15  cm water with the added need of oxygen at 2 liter.      RECOMMENDATIONS:    CPAP will be initiated at 15 cm water pressure, using a small sized ESON mask and CPAP being auto-titration capable in preparation for bariatric changes. Obesity hypoventilation: Oxygen dependence was documented and 02 will be needed at 2 liters being bled into the CPAP. We will re-evaluate for oxygen need after a successful weight loss therapy.    A follow up appointment will be scheduled in the Sleep Clinic at [facility].      I certify that I have reviewed the entire raw data recording prior to the issuance of this report in accordance with the Standards of Accreditation of the American Academy of Sleep Medicine (AASM)     Larey Seat, M.D.    05-02-2018  Diplomat, American Board of Psychiatry and Neurology  [place], [place]  of Sleep Medicine Medical Director, [employer] [facility] at [facility]

## 2018-05-02 NOTE — Addendum Note (Signed)
Addended by: Larey Seat on: 05/02/2018 08:44 AM   Modules accepted: Orders

## 2018-05-03 ENCOUNTER — Telehealth: Payer: Self-pay | Admitting: Neurology

## 2018-05-03 NOTE — Telephone Encounter (Signed)
I called Shirley Schmidt. I advised Shirley Schmidt that Dr.  Brett Fairy reviewed their sleep study results and found that Shirley Schmidt has severe sleep apnea. Dr. Brett Fairy recommends that Shirley Schmidt starts CPAP. I reviewed PAP compliance expectations with the Shirley Schmidt. Shirley Schmidt is agreeable to starting a CPAP. I advised Shirley Schmidt that an order will be sent to a DME, [facility], and [facility] will call the Shirley Schmidt within about one week after they file with the Shirley Schmidt's insurance. [facility] will show the Shirley Schmidt how to use the machine, fit for masks, and troubleshoot the CPAP if needed. A follow up appt was made for insurance purposes with Dr. Brett Fairy on Oct 29,2019 at 8:30 am. Shirley Schmidt verbalized understanding to arrive 15 minutes early and bring their CPAP. A letter with all of this information in it will be mailed to the Shirley Schmidt as a reminder. I verified with the Shirley Schmidt that the address we have on file is correct. Shirley Schmidt verbalized understanding of results. Shirley Schmidt had no questions at this time but was encouraged to call back if questions arise.

## 2018-05-03 NOTE — Telephone Encounter (Signed)
-----   Message from Larey Seat, MD sent at 05/02/2018  8:44 AM EDT ----- POLYSOMNOGRAPHY IMPRESSION :   1. Severest Obstructive Sleep Apnea(OSA) with an AHI of 126/h and  severe hypoxemia and hypercapnia-  2. Obesity hypoventilation is confirmed by the above gas exchange  problem in context with the BMI. 3. Patient responded well to CPAP at 13, 14 and 15 cm water with  the added need of oxygen at 2 liter.     RECOMMENDATIONS:   CPAP will be initiated at 15 cm water pressure, using a small  sized ESON mask and CPAP being auto-titration capable in  preparation for bariatric changes. Obesity hypoventilation: Oxygen dependence was documented and 02  will be needed at 2 liters being bled into the CPAP. We will  re-evaluate for oxygen need after a successful weight loss  therapy.

## 2018-07-23 ENCOUNTER — Encounter: Payer: Self-pay | Admitting: Neurology

## 2018-07-25 ENCOUNTER — Encounter: Payer: Self-pay | Admitting: Neurology

## 2018-07-25 ENCOUNTER — Ambulatory Visit: Payer: BLUE CROSS/BLUE SHIELD | Admitting: Neurology

## 2018-07-25 VITALS — BP 143/89 | HR 83 | Ht 65.0 in | Wt 354.0 lb

## 2018-07-25 DIAGNOSIS — Z6841 Body Mass Index (BMI) 40.0 and over, adult: Secondary | ICD-10-CM

## 2018-07-25 DIAGNOSIS — G4733 Obstructive sleep apnea (adult) (pediatric): Secondary | ICD-10-CM | POA: Diagnosis not present

## 2018-07-25 DIAGNOSIS — R942 Abnormal results of pulmonary function studies: Secondary | ICD-10-CM

## 2018-07-25 DIAGNOSIS — E662 Morbid (severe) obesity with alveolar hypoventilation: Secondary | ICD-10-CM | POA: Diagnosis not present

## 2018-07-25 DIAGNOSIS — I2782 Chronic pulmonary embolism: Secondary | ICD-10-CM

## 2018-07-25 NOTE — Progress Notes (Signed)
SLEEP MEDICINE CLINIC   Provider:  Larey Seat, M D  Primary Care Physician:  Hayden Rasmussen, MD   Referring Provider: Dr. Albertina Parr, Maebelle Munroe, MD    Chief Complaint  Patient presents with  . New Patient (Initial Visit)    Room 10. Pt here alone. Pt was followed by Dr. Krista Blue and referred for PSG , had a sleep study here in 08-11-2012, diagnosed with OSA at [facility] but could not afford CPAP at the time, remained untreated. Morbidly obese, quit smoking 2009 , now wants CPAP.     HPI:  Shirley Schmidt is a 47 y.o. female , here for follow up of recent sleep study on 07-25-2018. 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  I had the pleasure of meeting today with Shirley Schmidt patient that had originally seen me for a sleep study upon referral of my colleague Dr. Krista Blue, in 2013 at the time she was diagnosed with severe OSA but could not afford CPAP and remained untreated.  Patient underwent a split-night titration study on 28 April 2018 based on a BMI of 64.2, elevated baseline blood pressures in the 347Q systolic at peak, she is a survivor of uterine cancer, she has suffered a pulmonary embolism after a right leg DVT, and suffers from exertional dyspnea.  She also has excessive daytime sleepiness, snoring and restless legs.  The baseline diagnostic study documented an AHI of 126.3 there were 40 frank obstructive sleep apneas and 101 hypercapnia's loud snoring was noted in addition.  The patient slept almost equal amounts in supine and nonsupine sleep position with equal AHI's.  No REM sleep was noted, her end-tidal CO2 peaked above 50 torr, and she spent 67 minutes total in desaturation below 89% SPO2.  This in relation to a total sleep time of only 67 minutes.  There were no additional PLM's, her heart rate was in normal sinus rhythm, the patient was titrated to CPAP at 5 and advanced to 15 cmH2O.  2 L of oxygen were finally added to up to in improve the oxygen saturation.  She was also  briefly tried on BiPAP settings of 17/13 cm water - but this did not improve her overall condition above a 15 cm CPAP setting.  Sleep efficiency was much improved under CPAP at 88%, her sleep latency was only 11.5 minutes.  She still spent 100 minutes and oxygen desaturation under 2 L of oxygen.  I have ordered an auto titration capable CPAP to be set at 15 cmH2O pressure is a small size ESON mask. The patient has become 100% compliant with CPAP use, the data were downloaded for 30 days prior to 23 July 2018, average user time is 5 hours 42 minutes, she is using an AutoSet between 9 and 16 cmH2O with 3 cm EPR 95th percentile pressure is 12.2, residual AHI is 0.9 no central apneas are emerging and she has been comfortable with using the device.   I would like for her to have an overnight pulse oximetry finger probe while using CPAP so that we can make sure that her oxygen desaturation is appropriately treated.    Patient was seen here on March 27, 2018  in a referral  from Dr. Darron Doom for a new sleep evaluation, I had the pleasure of meeting with Shirley Schmidt in the year 2013.  I recaptured briefly what happened at the time, my colleague Dr. Krista Blue had referred the patient , who was a cancer  survivor at the time and  suffered from essential hypertension, super- obesity, nocturia,  Insomnia, excessive  daytime sleepiness and had endorsed the Epworth Sleepiness Scale at 16 /24 points , at the [place] at 11 points,  with a BMI at the time of 52.  She also was on hydrocodone-acetaminophen at the time which showed a post risk of inducing sleep apnea as well.  The baseline study documented 166 respiratory events 6 obstructive, 7 central apneas and 153 hyper apneas.  There were also additional respiratory event related arousals related to loud snoring.  The AHI was 76.6, the REM AHI was 82, the supine AHI 120/h of sleep.  Desaturation index was 76/h SPO2 under 90% was 27 minutes, with a nadir of 78%,  there were no periodic limb movements.  The patient was titrated to a CPAP pressure of 9 cm with an AHI of 0.  Again she could not afford CPAP at the time  Today is 12 April 2018 and Shirley Schmidt presents now with bariatric needs having reached a super obesity BMI of 64.2, baseline blood pressure in the 884Z and 660Y systolic, heart rate elevated over 80 at rest, she is a survivor of uterine cancer but has completed all her therapies, she has some back pain, she is excessively daytime sleepy which interferes with her ability to drive, to concentrate at work to stay alert.  She endorsed snoring, joint pain frequent infections, insomnia, daytime sleepiness, snoring and restless legs.  The patient had undergone a hysterectomy in 2009 related to the uterine cancer.  She is now taking gout medication, allopurinol, she is taking Wellbutrin in the morning chlorthalidone for hypertension control gabapentin for neuropathy . She had a DVT in 2014 and was 6 month on Xarelto .  Sleep habits are as follows: since being on CPAP she  goes to bed at midnight- and is asleep promptly. She  describes her bedroom as cool, quiet and dark and she avoids prone sleep and sleeping flat. Sleeps now on 1 pillows.  Nocturia less often on CPAP- most night none! Down form 4 at night. .  She dreams more . Bed parter reports her to  no longer loudly snore , no  tossing and turning.  She sets still 3 alarms, and by 7.30 she rises.  She feels much more  refreshed and restored from her sleep , no longer has the aching throbbing morning headaches.  She averages between 7-8  hours of sleep.  She tends to no longer to nap in daytime .  Sleep medical history and family sleep history: Super obesity, overweight al her life, has on all diets and appetite suppressants . Gout, wellbutrin for food craving. Prepares for bariatric surgery, gastric sleeve is planned. Had a DVT.  Parents were not diagnosed with OSA, but she suspects they have it- 2 half  siblings, obese, too.    Social history:  Single, full time gainfully employed. Non smoker since 10 years ago, ETOH- rarely- wine on weekends. Caffeine use - coffee 3 times a week, caffeinated diet sodas every day 2  Bev, Coke zero.  Remote histoyr of night shift work, second shift, now on first shift for 10 years.    Review of Systems: Out of a complete 14 system review, the patient complains of only the following symptoms, and all other reviewed systems are negative.  Epworth Sleepiness score endorsed at  5/ 24 from  20/ 24 pre cpap , Fatigue severity score down to 17/ 63 ,  from 49/63 points , no longer nocturia, feels refreshed, no dry mouth, no tossing and turning, sleeping through- her sense of smell has improved.   Social History   Socioeconomic History  . Marital status: Single    Spouse name: Not on file  . Number of children: Not on file  . Years of education: Not on file  . Highest education level: Not on file  Occupational History  . Not on file  Social Needs  . Financial resource strain: Not on file  . Food insecurity:    Worry: Not on file    Inability: Not on file  . Transportation needs:    Medical: Not on file    Non-medical: Not on file  Tobacco Use  . Smoking status: Former Smoker    Packs/day: 0.75    Years: 17.00    Pack years: 12.75    Types: Cigarettes    Last attempt to quit: 01/23/2008    Years since quitting: 10.5  . Smokeless tobacco: Never Used  Substance and Sexual Activity  . Alcohol use: Yes    Comment: rarely last drink was 1 glass of wine 3 weeks ago  . Drug use: Yes    Types: Marijuana    Comment: once a week  . Sexual activity: Not Currently  Lifestyle  . Physical activity:    Days per week: Not on file    Minutes per session: Not on file  . Stress: Not on file  Relationships  . Social connections:    Talks on phone: Not on file    Gets together: Not on file    Attends religious service: Not on file    Active member of club or  organization: Not on file    Attends meetings of clubs or organizations: Not on file    Relationship status: Not on file  . Intimate partner violence:    Fear of current or ex partner: Not on file    Emotionally abused: Not on file    Physically abused: Not on file    Forced sexual activity: Not on file  Other Topics Concern  . Not on file  Social History Narrative  . Not on file    Family History  Problem Relation Age of Onset  . Diabetes Mother   . Heart attack Father     Past Medical History:  Diagnosis Date  . Cellulitis ?2010   "right toe" (10/10/2012)  . Exertional dyspnea 10/05/2012   (10/10/2012)  . Gout   . Pulmonary embolism ([place]) 10/09/2012   bilateral/notes 10/09/2012 (10/10/2012)  . Right leg DVT ([place])   . Sleep apnea    "no mask" (10/10/2012)  . Trigeminal nerve disease   . Uterine cancer ([place]) 2009    Past Surgical History:  Procedure Laterality Date  . ABDOMINAL HYSTERECTOMY  2009    Current Outpatient Medications  Medication Sig Dispense Refill  . buPROPion (WELLBUTRIN XL) 300 MG 24 hr tablet   0  . chlorthalidone (HYGROTON) 25 MG tablet Take 25 mg by mouth daily.    . metFORMIN (GLUCOPHAGE-XR) 500 MG 24 hr tablet   0  . phentermine 15 MG capsule TK 1 C PO D  0   No current facility-administered medications for this visit.     Allergies as of 07/25/2018  . (No Known Allergies)    Vitals: BP (!) 143/89   Pulse 83   Ht 5' 5"  (1.651 m)   Wt (!) 354 lb (160.6 kg)  BMI 58.91 kg/m  Last Weight:  Wt Readings from Last 1 Encounters:  07/25/18 (!) 354 lb (160.6 kg)   GXQ:JJHE mass index is 58.91 kg/m.     Last Height:   Ht Readings from Last 1 Encounters:  07/25/18 5' 5"  (1.651 m)  RR 18/min.   Physical exam:  General: The patient is awake, alert and appears not in acute distress. The patient is well groomed. Head: Normocephalic, atraumatic. Neck is supple. Mallampati 5 , low and narrow- very small, not inflamed.  neck circumference :20.75".  Nasal airflow is  patent ,Retrognathia is not seen.  Cardiovascular:  Regular rate and rhythm , without  murmurs or carotid bruit, and without distended neck veins. Respiratory: Lungs are clear to auscultation. Skin:  Without evidence of edema, or rash Trunk: BMI is 58 from 63. 4 kg/m2 before CPAP.  Neurologic exam : The patient is awake and alert, oriented to place and time.   Attention span & concentration ability appears normal.  Speech is fluent,  without dysarthria, dysphonia or aphasia.  Mood and affect are appropriate.  Cranial nerves: Pupils are equal and briskly reactive to light.  Visual fields by finger perimetry are intact.Hearing to finger rub intact.  Facial sensation intact to fine touch. Facial motor strength is symmetric, her tongue is midline. Very short thick neck with limited ROM. Shoulder shrug was symmetrical.   Motor exam: Normal tone, muscle bulk and symmetric strength in all extremities. Sensory:  Fine touch, pinprick and vibration were tested in all extremities. Proprioception tested in the upper extremities was normal. Coordination:  Gait and station: Patient walks wide based due to obesity.  Stance is wide based.  Deep tendon reflexes: in the upper and lower extremities are symmetrically attenuated  and intact.   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  Oxygen dependent at 2 liters.  CPAP compliant at 100 %. Needs a ONO while on CPAP with oxygen to confirm currnt 2 liters are enough.   2) Obesity hypoventilation in a patient with super-obesity, papilledema, morning headaches and daytime fatigue, baseline RR and heart rate are elevated, shortness of breath is felt with minimal exertion.     3) Has factor 5 leiden  and developed DVT, PE s- these may have further decreased her pulmonary capacity.    I spent more than 20 minutes of face to face time with the patient. She will pursue weight loss surgery- at [facility].  Rv with NP in 2- 3 month - with check on ONO  Greater than 50% of time was spent in counseling and coordination of care. We have discussed the diagnosis and differential and I answered the patient's questions    Larey Seat, MD   17/40/8144, 8:18 AM  Certified in Neurology by ABPN Certified in [place] by [facility] [address], [place], [place] [address]

## 2018-10-31 ENCOUNTER — Encounter: Payer: Self-pay | Admitting: Neurology

## 2018-10-31 NOTE — Progress Notes (Signed)
[facility]  PATIENT: Shirley Schmidt DOB: 02/01/1971   REASON FOR VISIT: Follow-up for obstructive sleep apnea here for CPAP compliance HISTORY FROM: Patient alone at visit    [name] 2/5/2020CM Ms. Terrio, 48 year old female returns for follow-up with history of obstructive sleep apnea here for CPAP compliance.  She is doing well with her machine.  Compliance data dated 10/02/2018-10/31/2018 shows compliance greater than 4 hours at 90% for 27 days.  Less than 4 hours 10% for 3 days for total compliance usage days of 100%.  Average usage 5 hours 39 minutes.  Set pressure 9 to 16 cm.  EPR level 3 AHI 0.6 ESS score 5.  She returns for reevaluation 10/29/19CDI had the pleasure of meeting today with Shirley Schmidt patient that had originally seen me for a sleep study upon referral of my colleague Dr. Krista Blue, in 2013 at the time she was diagnosed with severe OSA but could not afford CPAP and remained untreated.  Patient underwent a split-night titration study on 28 April 2018 based on a BMI of 64.2, elevated baseline blood pressures in the 536R systolic at peak, she is a survivor of uterine cancer, she has suffered a pulmonary embolism after a right leg DVT, and suffers from exertional dyspnea.  She also has excessive daytime sleepiness, snoring and restless legs.  The baseline diagnostic study documented an AHI of 126.3 there were 40 frank obstructive sleep apneas and 101 hypercapnia's loud snoring was noted in addition.  The patient slept almost equal amounts in supine and nonsupine sleep position with equal AHI's.  No REM sleep was noted, her end-tidal CO2 peaked above 50 torr, and she spent 67 minutes total in desaturation below 89% SPO2.  This in relation to a total sleep time of only 67 minutes.  There were no additional PLM's, her heart rate was in normal sinus rhythm, the patient was titrated to CPAP at 5 and advanced to 15 cmH2O.  2 L of oxygen were finally  added to up to in improve the oxygen saturation.  She was also briefly tried on BiPAP settings of 17/13 cm water - but this did not improve her overall condition above a 15 cm CPAP setting.  Sleep efficiency was much improved under CPAP at 88%, her sleep latency was only 11.5 minutes.  She still spent 100 minutes and oxygen desaturation under 2 L of oxygen.  I have ordered an auto titration capable CPAP to be set at 15 cmH2O pressure is a small size ESON mask. The patient has become 100% compliant with CPAP use, the data were downloaded for 30 days prior to 23 July 2018, average user time is 5 hours 42 minutes, she is using an AutoSet between 9 and 16 cmH2O with 3 cm EPR 95th percentile pressure is 12.2, residual AHI is 0.9 no central apneas are emerging and she has been comfortable with using the device.   I would like for her to have an overnight pulse oximetry finger probe while using CPAP so that we can make sure that her oxygen desaturation is appropriately treated.   REVIEW OF SYSTEMS: Full 14 system review of systems performed and notable only for those listed, all others are neg:  Constitutional: neg  Cardiovascular: neg Ear/Nose/Throat: neg  Skin: neg Eyes: neg Respiratory: neg Gastroitestinal: neg  Hematology/Lymphatic: neg  Endocrine: neg Musculoskeletal:neg Allergy/Immunology: neg Neurological: neg Psychiatric: neg Sleep : Obstructive sleep apnea with CPAP use   ALLERGIES: No Known Allergies  HOME MEDICATIONS:  Outpatient Medications Prior to Visit  Medication Sig Dispense Refill  . buPROPion (WELLBUTRIN XL) 300 MG 24 hr tablet   0  . chlorthalidone (HYGROTON) 25 MG tablet Take 25 mg by mouth daily.    . metFORMIN (GLUCOPHAGE-XR) 500 MG 24 hr tablet   0  . phentermine 15 MG capsule TK 1 C PO D  0  . chlorthalidone (HYGROTON) 25 MG tablet Take 25 mg by mouth daily.     No facility-administered medications prior to visit.     PAST MEDICAL HISTORY: Past Medical  History:  Diagnosis Date  . Cellulitis ?2010   "right toe" (10/10/2012)  . Exertional dyspnea 10/05/2012   (10/10/2012)  . Gout   . Pulmonary embolism ([place]) 10/09/2012   bilateral/notes 10/09/2012 (10/10/2012)  . Right leg DVT ([place])   . Sleep apnea    "no mask" (10/10/2012)  . Trigeminal nerve disease   . Uterine cancer ([place]) 2009    PAST SURGICAL HISTORY: Past Surgical History:  Procedure Laterality Date  . ABDOMINAL HYSTERECTOMY  2009    FAMILY HISTORY: Family History  Problem Relation Age of Onset  . Diabetes Mother   . Heart attack Father     SOCIAL HISTORY: Social History   Socioeconomic History  . Marital status: Single    Spouse name: Not on file  . Number of children: Not on file  . Years of education: Not on file  . Highest education level: Not on file  Occupational History  . Not on file  Social Needs  . Financial resource strain: Not on file  . Food insecurity:    Worry: Not on file    Inability: Not on file  . Transportation needs:    Medical: Not on file    Non-medical: Not on file  Tobacco Use  . Smoking status: Former Smoker    Packs/day: 0.75    Years: 17.00    Pack years: 12.75    Types: Cigarettes    Last attempt to quit: 01/23/2008    Years since quitting: 10.7  . Smokeless tobacco: Never Used  Substance and Sexual Activity  . Alcohol use: Yes    Comment: rarely last drink was 1 glass of wine 3 weeks ago  . Drug use: Yes    Types: Marijuana    Comment: once a week  . Sexual activity: Not Currently  Lifestyle  . Physical activity:    Days per week: Not on file    Minutes per session: Not on file  . Stress: Not on file  Relationships  . Social connections:    Talks on phone: Not on file    Gets together: Not on file    Attends religious service: Not on file    Active member of club or organization: Not on file    Attends meetings of clubs or organizations: Not on file    Relationship status: Not on file  . Intimate partner violence:     Fear of current or ex partner: Not on file    Emotionally abused: Not on file    Physically abused: Not on file    Forced sexual activity: Not on file  Other Topics Concern  . Not on file  Social History Narrative  . Not on file     PHYSICAL EXAM  Vitals:   11/01/18 0722  BP: 126/79  Pulse: 72  Resp: 18  Weight: (!) 326 lb (147.9 kg)  Height: 5' 5"  (1.651 m)   Body mass index  is 54.25 kg/m.  Generalized: Well developed, morbidly obese female in no acute distress  Head: normocephalic and atraumatic,. Oropharynx benign mallopatti5 Neck: Supple, circumference 21, short thick neck Lungs clear Musculoskeletal: No deformity  Skin no rash or edema Neurological examination   Mentation: Alert oriented to time, place, history taking. Attention span and concentration appropriate. Recent and remote memory intact.  Follows all commands speech and language fluent.   Cranial nerve II-XII: Pupils were equal round reactive to light extraocular movements were full, visual field were full on confrontational test. Facial sensation and strength were normal. hearing was intact to finger rubbing bilaterally. Uvula tongue midline. head turning and shoulder shrug were normal and symmetric.Tongue protrusion into cheek strength was normal. Motor: normal bulk and tone, full strength in the BUE, BLE,  Sensory: normal and symmetric to light touch,  Coordination: finger-nose-finger, heel-to-shin bilaterally, no dysmetria Gait and Station: Rising up from seated position without assistance, wide-based gait can heel toe and tandem without difficulty DIAGNOSTIC DATA (LABS, IMAGING, TESTING) - I reviewed patient records, labs, notes, testing and imaging myself where available.  Lab Results  Component Value Date   WBC 5.5 11/03/2012   HGB 13.2 11/03/2012   HCT 38.4 11/03/2012   MCV 82.8 11/03/2012   PLT 162 11/03/2012      Component Value Date/Time   NA 139 11/03/2012 1140   K 4.0 11/03/2012 1140    CL 102 11/03/2012 1140   CO2 25 11/03/2012 1140   GLUCOSE 93 11/03/2012 1140   BUN 13 11/03/2012 1140   CREATININE 0.80 11/03/2012 1140   CALCIUM 9.6 11/03/2012 1140   PROT 7.6 10/09/2012 1731   ALBUMIN 3.9 10/09/2012 1731   AST 46 (H) 10/09/2012 1731   ALT 26 10/09/2012 1731   ALKPHOS 72 10/09/2012 1731   BILITOT 0.2 (L) 10/09/2012 1731   GFRNONAA >90 11/03/2012 1140   GFRAA >90 11/03/2012 1140    Lab Results  Component Value Date   HGBA1C 8.3 (H) 10/10/2012   No results found for: [id number] Lab Results  Component Value Date   TSH 4.255 10/10/2012      ASSESSMENT AND PLAN  48 y.o. year old female  has a past medical history of  Exertional dyspnea (10/05/2012), Gout, Pulmonary embolism ([place]) (10/09/2012), Right leg DVT ([place]), Sleep apnea, Trigeminal nerve disease, and Uterine cancer ([place]) (2009).  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   Oxygen dependent at 2 liters.    She is here for CPAP compliance.Data dated 10/02/2018-10/31/2018 shows compliance greater than 4 hours at 90% for 27 days.  Less than 4 hours 10% for 3 days for total compliance usage days of 100%.  Average usage 5 hours 39 minutes.  Set pressure 9 to 16 cm.  EPR level 3 AHI 0.6 ESS score 5      PLAN:  CPAP compliance 90% Continue same settings Follow-up yearly and as needed Dennie Bible, [facility], [facility], APRN  [facility] [address], [place], [place] [address] [phone]

## 2018-11-01 ENCOUNTER — Other Ambulatory Visit: Payer: Self-pay

## 2018-11-01 ENCOUNTER — Encounter: Payer: Self-pay | Admitting: Nurse Practitioner

## 2018-11-01 ENCOUNTER — Ambulatory Visit: Payer: BLUE CROSS/BLUE SHIELD | Admitting: Nurse Practitioner

## 2018-11-01 VITALS — BP 126/79 | HR 72 | Resp 18 | Ht 65.0 in | Wt 326.0 lb

## 2018-11-01 DIAGNOSIS — G4733 Obstructive sleep apnea (adult) (pediatric): Secondary | ICD-10-CM | POA: Diagnosis not present

## 2018-11-01 DIAGNOSIS — Z9989 Dependence on other enabling machines and devices: Secondary | ICD-10-CM

## 2018-11-01 NOTE — Patient Instructions (Signed)
CPAP compliance 90% Continue same settings Follow-up yearly and as needed

## 2018-11-27 ENCOUNTER — Other Ambulatory Visit: Payer: Self-pay | Admitting: Family Medicine

## 2018-11-27 DIAGNOSIS — Z1231 Encounter for screening mammogram for malignant neoplasm of breast: Secondary | ICD-10-CM

## 2019-03-08 ENCOUNTER — Other Ambulatory Visit (HOSPITAL_COMMUNITY): Payer: Self-pay | Admitting: Surgery

## 2019-03-08 ENCOUNTER — Other Ambulatory Visit: Payer: Self-pay | Admitting: Surgery

## 2019-03-21 ENCOUNTER — Encounter (HOSPITAL_COMMUNITY): Payer: Self-pay

## 2019-03-21 ENCOUNTER — Ambulatory Visit (HOSPITAL_COMMUNITY): Payer: BLUE CROSS/BLUE SHIELD

## 2019-09-04 ENCOUNTER — Telehealth: Payer: Self-pay | Admitting: Neurology

## 2019-09-04 NOTE — Telephone Encounter (Signed)
Pt called in and stated the machine that is added to her apnea machine she wants to know if she can stop it

## 2019-09-04 NOTE — Telephone Encounter (Signed)
Called the patient back, there was no answer. LVM for the patient calls back.   **en patient calls back please ask her if she is referring to Oxygen machine. Per her las Sleep study 04/28/2018. Dr Dohmeier ordered a CPAP machine with 2 L of oxygen bled into the machine. We would not recommend stopping the oxygen because it was needed based off the sleep study. The only way we would be able to know if she can come off the oxygen is by repeating a sleep study or completing a Overnight oximetry test.  If patient has more questions please attempt to schedule a follow up visit with NP.**

## 2019-09-06 NOTE — Telephone Encounter (Signed)
Pt returned call and she is stating she would like to not use the oxygen concentrator with her machine. She states CPAP is working well there are no issues there but she wanted to get rid of the oxygen. She cant travel with that and its just too much.  I reviewed the patient's sleep study with her from 04/2018 informing her that the oxygen level was not treated with PAP therapy alone it required them having to place the patient on 2 L of oxygen. In fact per the oct 2019 follow up it was ordered that the patient have a ONO on CPAP with the 2L to see if that was truly treating her apnea. Advised that based off of the data we could not tell her to stop using the oxygen without testing her first. Informed her that one way we could do that is by having her do a ONO without the oxygen to the CPAP and see if the oxygen level is better. If not then she will need to continue with it.  Pt was close to needing her yearly apt anyway so I have scheduled her for a VV with Debbora Presto, NP to review her therapy data and place this order if she feels it is necessary. Patient states since her 04/2018 she has lost close to 100lbs. Informed her that may be very helpful with seeing how she does without the oxygen since she has lost so much weight. Patient was texted the link to sign up for mychart and advised to check in 30 min prior to scheduled apt. Pt verbalized understanding.

## 2019-09-11 ENCOUNTER — Telehealth: Payer: Self-pay | Admitting: Adult Health

## 2019-10-02 ENCOUNTER — Telehealth: Payer: Self-pay | Admitting: Adult Health

## 2019-10-02 NOTE — Progress Notes (Deleted)
PATIENT: Shirley Schmidt DOB: [birth year]  REASON FOR VISIT: follow up HISTORY FROM: patient  Virtual Visit via Video Note  I connected with Shirley Schmidt on 10/02/19 at  2:30 PM EST by a video enabled telemedicine application located remotely at [facility] and verified that I am speaking with the correct person using two identifiers who was located at their own home.   I discussed the limitations of evaluation and management by telemedicine and the availability of in person appointments. The patient expressed understanding and agreed to proceed.   PATIENT: Shirley Schmidt DOB: 06/03/1971  REASON FOR VISIT: follow up HISTORY FROM: patient  HISTORY OF PRESENT ILLNESS: Today 10/02/19:  Shirley Schmidt is a 49 year old female with a history of obstructive sleep apnea on CPAP.  Her download indicates that she use her machine nightly for compliance of 100%.  She use her machine greater than 4 hours each night.  On average she uses her machine 6 hours and 17 minutes.  Her residual AHI is 1.4 on 9 to 16 cm of water with EPR of 3.  Her leak in the 95th percentile is 34.3 L/min.  HISTORY 2/5/2020CM Shirley Schmidt, 49 year old female returns for follow-up with history of obstructive sleep apnea here for CPAP compliance.  She is doing well with her machine.  Compliance data dated 10/02/2018-10/31/2018 shows compliance greater than 4 hours at 90% for 27 days.  Less than 4 hours 10% for 3 days for total compliance usage days of 100%.  Average usage 5 hours 39 minutes.  Set pressure 9 to 16 cm.  EPR level 3 AHI 0.6 ESS score 5.  She returns for reevaluation  REVIEW OF SYSTEMS: Out of a complete 14 system review of symptoms, the patient complains only of the following symptoms, and all other reviewed systems are negative.  ALLERGIES: No Known Allergies  HOME MEDICATIONS: Outpatient Medications Prior to Visit  Medication Sig Dispense Refill  . buPROPion (WELLBUTRIN XL) 300 MG 24 hr  tablet   0  . chlorthalidone (HYGROTON) 25 MG tablet Take 25 mg by mouth daily.    . metFORMIN (GLUCOPHAGE-XR) 500 MG 24 hr tablet   0  . phentermine 15 MG capsule TK 1 C PO D  0   No facility-administered medications prior to visit.    PAST MEDICAL HISTORY: Past Medical History:  Diagnosis Date  . Cellulitis ?2010   "right toe" (10/10/2012)  . Exertional dyspnea 10/05/2012   (10/10/2012)  . Gout   . Pulmonary embolism ([place]) 10/09/2012   bilateral/notes 10/09/2012 (10/10/2012)  . Right leg DVT ([place])   . Sleep apnea    "no mask" (10/10/2012)  . Trigeminal nerve disease   . Uterine cancer ([place]) 2009    PAST SURGICAL HISTORY: Past Surgical History:  Procedure Laterality Date  . ABDOMINAL HYSTERECTOMY  2009    FAMILY HISTORY: Family History  Problem Relation Age of Onset  . Diabetes Mother   . Heart attack Father     SOCIAL HISTORY: Social History   Socioeconomic History  . Marital status: Single    Spouse name: Not on file  . Number of children: Not on file  . Years of education: Not on file  . Highest education level: Not on file  Occupational History  . Not on file  Tobacco Use  . Smoking status: Former Smoker    Packs/day: 0.75    Years: 17.00    Pack years: 12.75    Types: Cigarettes    Quit  date: 01/23/2008    Years since quitting: 11.6  . Smokeless tobacco: Never Used  Substance and Sexual Activity  . Alcohol use: Yes    Comment: rarely last drink was 1 glass of wine 3 weeks ago  . Drug use: Yes    Types: Marijuana    Comment: once a week  . Sexual activity: Not Currently  Other Topics Concern  . Not on file  Social History Narrative  . Not on file   Social Determinants of Health   Financial Resource Strain:   . Difficulty of Paying Living Expenses: Not on file  Food Insecurity:   . Worried About Charity fundraiser in the Last Year: Not on file  . Ran Out of Food in the Last Year: Not on file  Transportation Needs:   . Lack of Transportation  (Medical): Not on file  . Lack of Transportation (Non-Medical): Not on file  Physical Activity:   . Days of Exercise per Week: Not on file  . Minutes of Exercise per Session: Not on file  Stress:   . Feeling of Stress : Not on file  Social Connections:   . Frequency of Communication with Friends and Family: Not on file  . Frequency of Social Gatherings with Friends and Family: Not on file  . Attends Religious Services: Not on file  . Active Member of Clubs or Organizations: Not on file  . Attends Archivist Meetings: Not on file  . Marital Status: Not on file  Intimate Partner Violence:   . Fear of Current or Ex-Partner: Not on file  . Emotionally Abused: Not on file  . Physically Abused: Not on file  . Sexually Abused: Not on file      PHYSICAL EXAM Generalized: Well developed, in no acute distress   Neurological examination  Mentation: Alert oriented to time, place, history taking. Follows all commands speech and language fluent Cranial nerve II-XII:Extraocular movements were full. Facial symmetry noted. uvula tongue midline. Head turning and shoulder shrug  were normal and symmetric. Motor: Good strength throughout subjectively per patient Sensory: Sensory testing is intact to soft touch on all 4 extremities subjectively per patient Coordination: Cerebellar testing reveals good finger-nose-finger  Gait and station: Patient is able to stand from a seated position. gait is normal.  Reflexes: UTA  DIAGNOSTIC DATA (LABS, IMAGING, TESTING) - I reviewed patient records, labs, notes, testing and imaging myself where available.  Lab Results  Component Value Date   WBC 5.5 11/03/2012   HGB 13.2 11/03/2012   HCT 38.4 11/03/2012   MCV 82.8 11/03/2012   PLT 162 11/03/2012      Component Value Date/Time   NA 139 11/03/2012 1140   K 4.0 11/03/2012 1140   CL 102 11/03/2012 1140   CO2 25 11/03/2012 1140   GLUCOSE 93 11/03/2012 1140   BUN 13 11/03/2012 1140    CREATININE 0.80 11/03/2012 1140   CALCIUM 9.6 11/03/2012 1140   PROT 7.6 10/09/2012 1731   ALBUMIN 3.9 10/09/2012 1731   AST 46 (H) 10/09/2012 1731   ALT 26 10/09/2012 1731   ALKPHOS 72 10/09/2012 1731   BILITOT 0.2 (L) 10/09/2012 1731   GFRNONAA >90 11/03/2012 1140   GFRAA >90 11/03/2012 1140   No results found for: CHOL, HDL, LDLCALC, LDLDIRECT, TRIG, CHOLHDL Lab Results  Component Value Date   HGBA1C 8.3 (H) 10/10/2012   No results found for: BTYOMAYO45 Lab Results  Component Value Date   TSH 4.255 10/10/2012  ASSESSMENT AND PLAN 49 y.o. year old female  has a past medical history of Cellulitis (?2010), Exertional dyspnea (10/05/2012), Gout, Pulmonary embolism ([place]) (10/09/2012), Right leg DVT ([place]), Sleep apnea, Trigeminal nerve disease, and Uterine cancer ([place]) (2009). here with ***   I spent 15 minutes with the patient. 50% of this time was spent   Ward Givens, MSN, NP-C 10/02/2019, 1:58 PM [facility] [address], [place], [place] [address] [phone]

## 2021-04-28 LAB — COLOGUARD: COLOGUARD: NEGATIVE

## 2024-07-09 ENCOUNTER — Ambulatory Visit
Admission: RE | Admit: 2024-07-09 | Discharge: 2024-07-09 | Disposition: A | Discharge status: Home / Self Care | Source: Ambulatory Visit | Attending: Family Medicine | Admitting: Family Medicine

## 2024-07-09 ENCOUNTER — Other Ambulatory Visit: Payer: Self-pay | Admitting: Family Medicine

## 2024-07-09 DIAGNOSIS — T1490XA Injury, unspecified, initial encounter: Secondary | ICD-10-CM

## 2024-07-28 LAB — COLOGUARD: COLOGUARD: NEGATIVE

## 2024-10-09 ENCOUNTER — Encounter (HOSPITAL_COMMUNITY): Payer: Self-pay | Admitting: Emergency Medicine

## 2024-10-09 ENCOUNTER — Ambulatory Visit (HOSPITAL_COMMUNITY): Admission: EM | Admit: 2024-10-09 | Discharge: 2024-10-09 | Disposition: A | Discharge status: Home / Self Care

## 2024-10-09 DIAGNOSIS — Z23 Encounter for immunization: Secondary | ICD-10-CM

## 2024-10-09 DIAGNOSIS — S61213A Laceration without foreign body of left middle finger without damage to nail, initial encounter: Secondary | ICD-10-CM

## 2024-10-09 HISTORY — DX: Type 2 diabetes mellitus without complications: E11.9

## 2024-10-09 HISTORY — DX: Essential (primary) hypertension: I10

## 2024-10-09 MED ORDER — TETANUS-DIPHTH-ACELL PERTUSSIS 5-2-15.5 LF-MCG/0.5 IM SUSP
0.5000 mL | Freq: Once | INTRAMUSCULAR | Status: AC
  Administered 2024-10-09: 0.5 mL via INTRAMUSCULAR

## 2024-10-09 MED ORDER — TETANUS-DIPHTH-ACELL PERTUSSIS 5-2-15.5 LF-MCG/0.5 IM SUSP
INTRAMUSCULAR | Status: AC
  Filled 2024-10-09: qty 0.5

## 2024-10-09 NOTE — ED Triage Notes (Signed)
 Pt cut left middle finger on a can last night. Pt reports that she can't get to stop bleeding after she took off her tight bandage. 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 Believes her last tetanus shot ws 2016.

## 2024-10-09 NOTE — ED Provider Notes (Signed)
 " [facility]    CSN: [id number] Arrival date & time: 10/09/24  0810      History   Chief Complaint Chief Complaint  Patient presents with   Laceration    HPI Shirley Schmidt is a 54 y.o. female.   Patient presents to clinic over concern of shallow laceration to distal fingertip of left middle finger, she cut this on a can opener last night. Rinsed with water yesterday and wrapped tightly. Started to bleed again this morning so she came into clinic.   Tdap in 2016.   The history is provided by the patient and medical records.  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 04/12/2018   Morbid obesity with body mass index (BMI) of 50.0 to 59.9 in adult [facility]) 04/12/2018   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      Home  Medications    Prior to Admission medications  Medication Sig Start Date End Date Taking? Authorizing Provider  allopurinol (ZYLOPRIM) 300 MG tablet Take 300 mg by mouth 2 (two) times daily. 07/08/24  Yes [provider]  buPROPion (WELLBUTRIN XL) 300 MG 24 hr tablet  03/15/18   [provider]  chlorthalidone (HYGROTON) 25 MG tablet Take 25 mg by mouth daily.    [provider]  metFORMIN (GLUCOPHAGE-XR) 500 MG 24 hr tablet  04/09/18   [provider]  phentermine 15 MG capsule TK 1 C PO D 07/20/18   [provider]  gabapentin  (NEURONTIN ) 100 MG capsule Take 3 capsules (300 mg total) by mouth 3 (three) times daily. 10/11/12 10/09/13  Theophilus Delma Tully CINDERELLA, MD  Rivaroxaban  (XARELTO ) 15 MG TABS tablet Take 1 tablet (15 mg total) by mouth 2 (two) times daily. For 21 days, followed by 20 mg daily after 21 days are complete. 10/11/12 10/09/13  Theophilus Delma Tully CINDERELLA, MD    Family History Family History  Problem Relation Age of Onset   Diabetes Mother    Heart attack Father     Social History Social History[1]   Allergies   Patient has no known allergies.   Review of Systems Review of Systems  Per HPI  Physical Exam Triage Vital Signs ED Triage Vitals  Encounter Vitals Group     BP 10/09/24 0828 136/87  Girls Systolic BP Percentile --      Girls Diastolic BP Percentile --      Boys Systolic BP Percentile --      Boys Diastolic BP Percentile --      Pulse Rate 10/09/24 0828 66     Resp 10/09/24 0828 18     Temp 10/09/24 0828 98.6 F (37 C)     Temp Source 10/09/24 0828 Oral     SpO2 10/09/24 0828 96 %     Weight --      Height --      Head Circumference --      Peak Flow --      Pain Score 10/09/24 0826 3     Pain Loc --      Pain Education --      Exclude from Growth Chart --    No data found.  Updated Vital Signs BP 136/87 (BP Location: Right Arm)   Pulse 66   Temp 98.6 F (37 C) (Oral)   Resp 18   SpO2  96%   Visual Acuity Right Eye Distance:   Left Eye Distance:   Bilateral Distance:    Right Eye Near:   Left Eye Near:    Bilateral Near:     Physical Exam Vitals and nursing note reviewed.  Constitutional:      Appearance: Normal appearance.  HENT:     Head: Normocephalic and atraumatic.     Right Ear: External ear normal.     Left Ear: External ear normal.     Nose: Nose normal.     Mouth/Throat:     Mouth: Mucous membranes are moist.  Eyes:     Conjunctiva/sclera: Conjunctivae normal.  Cardiovascular:     Rate and Rhythm: Normal rate.     Pulses: Normal pulses.  Pulmonary:     Effort: Pulmonary effort is normal. No respiratory distress.  Musculoskeletal:        General: Normal range of motion.  Skin:    General: Skin is warm and dry.     Capillary Refill: Capillary refill takes less than 2 seconds.      Neurological:     General: No focal deficit present.     Mental Status: She is alert.  Psychiatric:        Mood and Affect: Mood normal.      UC Treatments / Results  Labs (all labs ordered are listed, but only abnormal results are displayed) Labs Reviewed - No data to display  EKG   Radiology No results found.  Procedures Laceration Repair: L long finger  Date/Time: 10/09/2024 8:50 AM  Performed by: Mercer, Khaled Herda  G, FNP Authorized by: Mercer, Sem Mccaughey  G, FNP   Consent:    Consent obtained:  Verbal   Consent given by:  Patient   Risks, benefits, and alternatives were discussed: yes     Risks discussed:  Infection Universal protocol:    Procedure explained and questions answered to patient or proxy's satisfaction: yes     Patient identity confirmed:  Verbally with patient Anesthesia:    Anesthesia method:  None Laceration details:    Location:  Finger   Finger location:  L long finger   Length (cm):  2   Depth (mm):  0.5 Exploration:    Hemostasis achieved with:  Direct pressure   Wound exploration: wound explored through full range of  motion     Contaminated: no   Treatment:    Area cleansed with:  Chlorhexidine  Amount of cleaning:  Standard Skin repair:    Repair method:  Tissue adhesive Approximation:    Approximation:  Close Repair type:    Repair type:  Simple Post-procedure details:    Dressing:  Open (no dressing)  (including critical care time)  Medications Ordered in UC Medications  Tdap (ADACEL ) injection 0.5 mL (0.5 mLs Intramuscular Given 10/09/24 0850)    Initial Impression / Assessment and Plan / UC Course  I have reviewed the triage vital signs and the nursing notes.  Pertinent labs & imaging results that were available during my care of the patient were reviewed by me and considered in my medical decision making (see chart for details).  Vitals and triage reviewed, patient is hemodynamically stable.  Shallow laceration to the finger pad of the left middle finger.  Shallow, brisk capillary refill distally and full range of motion intact.  Wound is clean.  Tdap updated in clinic.  Wound cleaned and dried.  Tissue adhesive applied and tolerated well.  Nonsutured laceration care discussed.  Pain management reviewed.  Plan of care, follow-up care, and return precautions given, no questions at this time.    Final Clinical Impressions(s) / UC Diagnoses   Final diagnoses:  Laceration of left middle finger without foreign body without damage to nail, initial encounter     Discharge Instructions      Keep the wound and glu clean and dry  Do not pick or mess with the glue, it will eventually fall off  For any pain you can take 600 mg ibuprofen every 8 hours as needed  After the glue comes off you can clean the wound with warm water and dial soap or Hibiclens and apply a topical antibiotic ointment   Return to clinic for concerns of infection, warmth, swelling or drainage       ED Prescriptions   None    PDMP not reviewed this encounter.    [1]  Social History Tobacco Use    Smoking status: Former    Current packs/day: 0.00    Average packs/day: 0.8 packs/day for 17.0 years (12.8 ttl pk-yrs)    Types: Cigarettes    Start date: 01/23/1991    Quit date: 01/23/2008    Years since quitting: 16.7   Smokeless tobacco: Never  Substance Use Topics   Alcohol use: Yes    Comment: rarely last drink was 1 glass of wine 3 weeks ago   Drug use: Yes    Types: Marijuana    Comment: once a week     Ball, Trelyn Vanderlinde  G, FNP 10/09/24 [id number]  "

## 2024-10-09 NOTE — Discharge Instructions (Signed)
 Keep the wound and glu clean and dry  Do not pick or mess with the glue, it will eventually fall off  For any pain you can take 600 mg ibuprofen every 8 hours as needed  After the glue comes off you can clean the wound with warm water and dial soap or Hibiclens and apply a topical antibiotic ointment   Return to clinic for concerns of infection, warmth, swelling or drainage
